# Patient Record
Sex: Female | Born: 1965 | Race: White | Hispanic: No | Marital: Married | Smoking: Current every day smoker
Health system: Southern US, Community
[De-identification: ages and names within clinical notes are randomized; demographics above are authoritative.]

## PROBLEM LIST (undated history)

## (undated) DIAGNOSIS — I219 Acute myocardial infarction, unspecified: Secondary | ICD-10-CM

## (undated) DIAGNOSIS — I2510 Atherosclerotic heart disease of native coronary artery without angina pectoris: Secondary | ICD-10-CM

## (undated) DIAGNOSIS — G8929 Other chronic pain: Secondary | ICD-10-CM

## (undated) DIAGNOSIS — K259 Gastric ulcer, unspecified as acute or chronic, without hemorrhage or perforation: Secondary | ICD-10-CM

## (undated) DIAGNOSIS — I10 Essential (primary) hypertension: Secondary | ICD-10-CM

## (undated) DIAGNOSIS — E785 Hyperlipidemia, unspecified: Secondary | ICD-10-CM

## (undated) DIAGNOSIS — K219 Gastro-esophageal reflux disease without esophagitis: Secondary | ICD-10-CM

## (undated) HISTORY — PX: APPENDECTOMY: SHX54

## (undated) HISTORY — DX: Gastro-esophageal reflux disease without esophagitis: K21.9

## (undated) HISTORY — DX: Gastric ulcer, unspecified as acute or chronic, without hemorrhage or perforation: K25.9

## (undated) HISTORY — DX: Atherosclerotic heart disease of native coronary artery without angina pectoris: I25.10

## (undated) HISTORY — DX: Other chronic pain: G89.29

## (undated) HISTORY — PX: ANKLE SURGERY: SHX546

## (undated) HISTORY — DX: Hyperlipidemia, unspecified: E78.5

## (undated) HISTORY — PX: OTHER SURGICAL HISTORY: SHX169

## (undated) HISTORY — DX: Acute myocardial infarction, unspecified: I21.9

## (undated) HISTORY — DX: Essential (primary) hypertension: I10

---

## 1999-10-14 ENCOUNTER — Encounter (INDEPENDENT_AMBULATORY_CARE_PROVIDER_SITE_OTHER): Payer: Self-pay

## 1999-10-14 ENCOUNTER — Ambulatory Visit (HOSPITAL_COMMUNITY): Admission: RE | Admit: 1999-10-14 | Discharge: 1999-10-14 | Payer: Self-pay | Admitting: Obstetrics and Gynecology

## 1999-11-25 ENCOUNTER — Encounter: Payer: Self-pay | Admitting: Obstetrics & Gynecology

## 1999-11-25 ENCOUNTER — Ambulatory Visit (HOSPITAL_COMMUNITY): Admission: RE | Admit: 1999-11-25 | Discharge: 1999-11-25 | Payer: Self-pay | Admitting: Obstetrics & Gynecology

## 2000-07-16 ENCOUNTER — Other Ambulatory Visit: Admission: RE | Admit: 2000-07-16 | Discharge: 2000-07-16 | Payer: Self-pay | Admitting: Obstetrics & Gynecology

## 2001-07-19 ENCOUNTER — Other Ambulatory Visit: Admission: RE | Admit: 2001-07-19 | Discharge: 2001-07-19 | Payer: Self-pay | Admitting: Obstetrics & Gynecology

## 2002-07-25 ENCOUNTER — Other Ambulatory Visit: Admission: RE | Admit: 2002-07-25 | Discharge: 2002-07-25 | Payer: Self-pay | Admitting: Obstetrics & Gynecology

## 2003-07-26 ENCOUNTER — Other Ambulatory Visit: Admission: RE | Admit: 2003-07-26 | Discharge: 2003-07-26 | Payer: Self-pay | Admitting: Obstetrics & Gynecology

## 2004-07-17 ENCOUNTER — Ambulatory Visit: Payer: Self-pay | Admitting: Family Medicine

## 2004-08-19 ENCOUNTER — Other Ambulatory Visit: Admission: RE | Admit: 2004-08-19 | Discharge: 2004-08-19 | Payer: Self-pay | Admitting: Obstetrics & Gynecology

## 2005-04-30 ENCOUNTER — Inpatient Hospital Stay (HOSPITAL_COMMUNITY): Admission: AD | Admit: 2005-04-30 | Discharge: 2005-04-30 | Payer: Self-pay | Admitting: Obstetrics & Gynecology

## 2005-05-03 ENCOUNTER — Inpatient Hospital Stay (HOSPITAL_COMMUNITY): Admission: AD | Admit: 2005-05-03 | Discharge: 2005-05-03 | Payer: Self-pay | Admitting: Obstetrics & Gynecology

## 2005-05-06 ENCOUNTER — Inpatient Hospital Stay (HOSPITAL_COMMUNITY): Admission: AD | Admit: 2005-05-06 | Discharge: 2005-05-06 | Payer: Self-pay | Admitting: Obstetrics & Gynecology

## 2005-05-13 ENCOUNTER — Inpatient Hospital Stay (HOSPITAL_COMMUNITY): Admission: AD | Admit: 2005-05-13 | Discharge: 2005-05-13 | Payer: Self-pay | Admitting: Obstetrics and Gynecology

## 2005-05-14 ENCOUNTER — Ambulatory Visit (HOSPITAL_COMMUNITY): Admission: RE | Admit: 2005-05-14 | Discharge: 2005-05-14 | Payer: Self-pay | Admitting: Obstetrics & Gynecology

## 2005-05-14 ENCOUNTER — Encounter (INDEPENDENT_AMBULATORY_CARE_PROVIDER_SITE_OTHER): Payer: Self-pay | Admitting: Specialist

## 2008-02-29 ENCOUNTER — Other Ambulatory Visit: Admission: RE | Admit: 2008-02-29 | Discharge: 2008-02-29 | Payer: Self-pay | Admitting: Gynecology

## 2010-02-25 ENCOUNTER — Inpatient Hospital Stay (HOSPITAL_COMMUNITY): Admission: EM | Admit: 2010-02-25 | Discharge: 2010-02-27 | Payer: Self-pay | Admitting: Emergency Medicine

## 2010-02-25 ENCOUNTER — Ambulatory Visit: Payer: Self-pay | Admitting: Internal Medicine

## 2010-03-01 ENCOUNTER — Encounter: Payer: Self-pay | Admitting: Cardiovascular Disease

## 2010-03-06 ENCOUNTER — Telehealth (INDEPENDENT_AMBULATORY_CARE_PROVIDER_SITE_OTHER): Payer: Self-pay | Admitting: *Deleted

## 2010-03-13 ENCOUNTER — Ambulatory Visit: Payer: Self-pay | Admitting: Physician Assistant

## 2010-03-13 ENCOUNTER — Telehealth (INDEPENDENT_AMBULATORY_CARE_PROVIDER_SITE_OTHER): Payer: Self-pay | Admitting: *Deleted

## 2010-03-13 ENCOUNTER — Telehealth: Payer: Self-pay | Admitting: Physician Assistant

## 2010-03-13 DIAGNOSIS — E785 Hyperlipidemia, unspecified: Secondary | ICD-10-CM | POA: Insufficient documentation

## 2010-03-13 DIAGNOSIS — I2119 ST elevation (STEMI) myocardial infarction involving other coronary artery of inferior wall: Secondary | ICD-10-CM | POA: Insufficient documentation

## 2010-03-13 DIAGNOSIS — I2510 Atherosclerotic heart disease of native coronary artery without angina pectoris: Secondary | ICD-10-CM

## 2010-03-13 DIAGNOSIS — R42 Dizziness and giddiness: Secondary | ICD-10-CM | POA: Insufficient documentation

## 2010-03-14 ENCOUNTER — Encounter (HOSPITAL_COMMUNITY)
Admission: RE | Admit: 2010-03-14 | Discharge: 2010-06-04 | Payer: Self-pay | Source: Home / Self Care | Attending: Cardiovascular Disease | Admitting: Cardiovascular Disease

## 2010-03-14 ENCOUNTER — Telehealth (INDEPENDENT_AMBULATORY_CARE_PROVIDER_SITE_OTHER): Payer: Self-pay | Admitting: *Deleted

## 2010-04-03 ENCOUNTER — Encounter: Payer: Self-pay | Admitting: Cardiovascular Disease

## 2010-05-02 ENCOUNTER — Ambulatory Visit: Payer: Self-pay | Admitting: Cardiovascular Disease

## 2010-05-08 ENCOUNTER — Ambulatory Visit
Admission: RE | Admit: 2010-05-08 | Discharge: 2010-05-08 | Payer: Self-pay | Source: Home / Self Care | Attending: Cardiovascular Disease | Admitting: Cardiovascular Disease

## 2010-05-08 ENCOUNTER — Other Ambulatory Visit: Payer: Self-pay | Admitting: Cardiovascular Disease

## 2010-05-08 LAB — HEPATIC FUNCTION PANEL
ALT: 27 U/L (ref 0–35)
AST: 27 U/L (ref 0–37)
Albumin: 3.8 g/dL (ref 3.5–5.2)
Alkaline Phosphatase: 55 U/L (ref 39–117)
Bilirubin, Direct: 0.1 mg/dL (ref 0.0–0.3)
Total Bilirubin: 0.5 mg/dL (ref 0.3–1.2)
Total Protein: 6.8 g/dL (ref 6.0–8.3)

## 2010-05-08 LAB — LIPID PANEL
Cholesterol: 104 mg/dL (ref 0–200)
HDL: 35.8 mg/dL — ABNORMAL LOW (ref >39.00)
LDL Cholesterol: 54 mg/dL (ref 0–99)
Total CHOL/HDL Ratio: 3
Triglycerides: 73 mg/dL (ref 0.0–149.0)
VLDL: 14.6 mg/dL (ref 0.0–40.0)

## 2010-05-09 ENCOUNTER — Encounter: Payer: Self-pay | Admitting: Cardiovascular Disease

## 2010-05-14 ENCOUNTER — Encounter: Payer: Self-pay | Admitting: Cardiovascular Disease

## 2010-06-04 NOTE — Assessment & Plan Note (Signed)
Summary: eph/jml   Visit Type:  new pt Primary Provider:  Dr. Aldona Bar [facility] Ob/Gyn)  CC:  dizziness.  History of Present Illness: Primary Cardiologist:  Dr. Tonny Bollman  Shelby Fitzpatrick is a 45 year old female who presented to [facility] on October 24 with chest discomfort.  Her EKG demonstrated an inferior ST elevation myocardial infarction.  She was taken emergently to the cardiac catheterization lab.  She had total occlusion of the RCA and this was treated with a PROMUS DES stent.  She had bradycardia and was eventually placed on beta blocker therapy prior to discharge.    Since discharge, she has had some "twinges" in her chest.  She denies any symptoms like her MI.  She is walking about 15 mins a day without chest pain or sob.  She denies syncope.  She does have some lightheadedness at times.  No orthopnea or PND.  No edema.  Her cath site feels ok.  Current Medications (verified): 1)  Nitrostat 0.4 Mg Subl (Nitroglycerin) .[name] Kitchen.. 1 Tablet Under Tongue At Onset of Chest Pain; You May Repeat Every 5 Minutes For Up To 3 Doses. 2)  Aspirin Ec 325 Mg Tbec (Aspirin) .... Take One Tablet By Mouth Daily 3)  Lipitor 80 Mg Tabs (Atorvastatin Calcium) .... Once Daily 4)  Metoprolol Succinate 25 Mg Xr24h-Tab (Metoprolol Succinate) .... Take1/2 Tablet Two Times A Day 5)  Effient 10 Mg Tabs (Prasugrel Hcl) .... Take One Tablet By Mouth Daily  Allergies (verified): 1)  ! Pcn  Past History:  Past Medical History: Last updated: 03/13/2010 Coronary artery disease    a.  s/p Inferior STEMI 02/25/2010    b.  Cath 02/25/2010:  100% RCA (PCI with Promus DES); minor LAD and CFX (nothing more than 20%)    c.  EF 65% at cath 02/25/2010 Hyperlipidemia h/o ectopic pregnancy (ruptured)  Past Surgical History: Last updated: 03/13/2010 s/p Promus DES to RCA 02/25/2010 (Inferior STEMI)  Family History: Last updated: 03/13/2010 Family History of Coronary Artery Disease: Father with MI  at 67  Social History: Last updated: 03/13/2010 The patient works as a Research officer, trade union.   She does smoke cigarettes but quit after MI 02/2010 She denies any alcohol or drug use.   Family History: Reviewed history from 03/13/2010 and no changes required. Family History of Coronary Artery Disease: Father with MI at 60  Social History: The patient works as a Research officer, trade union.   She does smoke cigarettes but quit after MI 02/2010 She denies any alcohol or drug use.   Review of Systems       As per  the HPI.  All other systems reviewed and negative.   Vital Signs:  Patient profile:   45 year old female Height:      67 inches Weight:      152.50 pounds BMI:     23.97 Pulse rate:   74 / minute BP sitting:   110 / 76  (left arm) Cuff size:   regular  Vitals Entered By: Caralee Ates CMA (March 13, 2010 8:59 AM)  Physical Exam  General:  Well nourished, well developed, in no acute distress HEENT: normal Neck: no JVD Cardiac:  normal S1, S2; RRR; no murmur Lungs:  clear to auscultation bilaterally, no wheezing, rhonchi or rales Abd: soft, nontender, no hepatomegaly Ext: no edema; RFA site without hematoma or bruit Vascular: no carotid  bruits Skin: warm and dry Neuro:  CNs 2-12 intact, no focal abnormalities noted  EKG  Procedure date:  03/13/2010  Findings:      Normal sinus rhythm with rate of:  66 leftward axis TW inversions in 3, aVF  Impression & Recommendations:  Problem # 1:  MYOCARDIAL INFARCTION, 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 Reassurance.  She has some anxiety and I asked her to follow up with her PCP or [place] if her symptoms do not improve over time. She starts cardiac rehab soon. I would suggest she go back to 1/2 time in 2 weeks, and gradually get back to full time.  Problem # 2:  CORONARY ATHEROSCLEROSIS NATIVE CORONARY ARTERY (ICD-414.01)  Continue ASA and Effient. No anginal symptoms. I answered all her questions today. Follow up with  Dr. Excell Seltzer in 6 weeks.  Problem # 3:  HYPERLIPIDEMIA (ICD-272.4) Check FLP and LFTs in 8 weeks.  Her updated medication list for this problem includes:    Lipitor 80 Mg Tabs (Atorvastatin calcium) ..... Once daily  Problem # 4:  DIZZINESS (ICD-780.4) She did have some bradycardia in the hospital. She may be symptomatic from the beta blocker.   I have asked her to take Toprol 1/2 tab once daily for a week, then go back to two times a day if she is ok with this.  Patient Instructions: 1)  Your physician recommends that you schedule a follow-up appointment in: 6 weeks with Dr. Excell Seltzer. 2)  Your physician recommends that you return for a FASTING lipid profile: Fasting Lipids, LFT in 8 weeks. 272.4. 3)  Your physician has recommended you make the following change in your medication: Take Metoprolol 25 mg 1/2 tablet by mouth once a day for 1 week, 1/2 tablet twice a day there after.

## 2010-06-04 NOTE — Miscellaneous (Signed)
Summary: [facility] Physician Order/Treatment Plan   [facility] Physician Order/Treatment Plan   Imported By: Roderic Ovens 03/19/2010 11:39:16  _____________________________________________________________________  External Attachment:    Type:   Image     Comment:   External Document

## 2010-06-04 NOTE — Progress Notes (Signed)
----   Converted from flag ---- ---- 03/12/2010 12:59 PM, Erle Crocker wrote: Flag Received  ---- 03/12/2010 12:59 PM, Bary Leriche wrote: Blanca Friend, I am sending you this message because Julieta Gutting is out until the 14th and I wanted to notify someone in [facility]. that a FMLA is on the way to Dr, Excell Seltzer to review.  The patient's name is Sumiko Ceasar, dob 06-Mar-2066/  She had a cath and stent on the 10-24.  unknown if she is still out on leave, so I highlighted that question for Dr.Cooper to see. Thank you for all your help, Elease Hashimoto at [employer]. ------------------------------

## 2010-06-04 NOTE — Progress Notes (Signed)
  Phone Note Outgoing Call   Summary of Call: Kathlene November I told this patient to go back to work post MI the week of Thanksgiving at 1/2 time and gradually increase to full time after that. I think you probably have her FMLA papers to sign.  So, I told her I would talk to you and let her know if you had put something different on her forms. Thanks  Initial call taken by: Brynda Rim,  March 13, 2010 11:03 AM  Follow-up for Phone Call        agreed - thx Aloha Bartok. I haven't seen forms but I'm out of town this week. Follow-up by: Norva Karvonen, MD,  March 14, 2010 10:38 AM

## 2010-06-04 NOTE — Progress Notes (Signed)
  Walk in Patient Form Recieved " pt left FMLA papers" sent to [facility]  March 06, 2010 11:17 AM

## 2010-06-04 NOTE — Progress Notes (Signed)
Summary: Records Request  Faxed OV & EKG to [facility] at [facility] ([phone]). Kayla Tripp  March 14, 2010 11:37 AM  

## 2010-06-06 NOTE — Letter (Signed)
Summary: [facility] - Lipid  [facility], Main Office  [address]   [place], [place] [address]   Phone: [phone]  Fax: [phone]     May 09, 2010 MRN: [id number]   Shelby Fitzpatrick [address] [place], [place]  [address]   Dear Ms. Rozier,  We have reviewed your cholesterol results.  They are as follows:     Total Cholesterol:    104 (Desirable: less than 200)       HDL  Cholesterol:     35.80  (Desirable: greater than 40 for men and 50 for women)       LDL Cholesterol:       54  (Desirable: less than 100 for low risk and less than 70 for moderate to high risk)       Triglycerides:       73.0  (Desirable: less than 150)  Our recommendations include: Your cholesterol numbers are at goal.  Liver function is normal.  Please continue your current medications.  Call our office at the number listed above if you have any questions.  Lowering your LDL cholesterol is important, but it is only one of a large number of "risk factors" that may indicate that you are at risk for heart disease, stroke or other complications of hardening of the arteries.  Other risk factors include:   A.  Cigarette Smoking* B.  High Blood Pressure* C.  Obesity* D.   Low HDL Cholesterol (see yours above)* E.   Diabetes Mellitus (higher risk if your is uncontrolled) F.  Family history of premature heart disease G.  Previous history of stroke or cardiovascular disease    *These are risk factors YOU HAVE CONTROL OVER.  For more information, visit .  There is now evidence that lowering the TOTAL CHOLESTEROL AND LDL CHOLESTEROL can reduce the risk of heart disease.  The American Heart Association recommends the following guidelines for the treatment of elevated cholesterol:  1.  If there is now current heart disease and less than two risk factors, TOTAL CHOLESTEROL should be less than 200 and LDL CHOLESTEROL should be less than 100. 2.  If there is current heart disease or two  or more risk factors, TOTAL CHOLESTEROL should be less than 200 and LDL CHOLESTEROL should be less than 70.  A diet low in cholesterol, saturated fat, and calories is the cornerstone of treatment for elevated cholesterol.  Cessation of smoking and exercise are also important in the management of elevated cholesterol and preventing vascular disease.  Studies have shown that 30 to 60 minutes of physical activity most days can help lower blood pressure, lower cholesterol, and keep your weight at a healthy level.  Drug therapy is used when cholesterol levels do not respond to therapeutic lifestyle changes (smoking cessation, diet, and exercise) and remains unacceptably high.  If medication is started, it is important to have you levels checked periodically to evaluate the need for further treatment options.  Thank you,  Julieta Gutting RN [facility] Team

## 2010-06-06 NOTE — Assessment & Plan Note (Signed)
Summary: per check out/sf   Visit Type:  Follow-up Primary Provider:  Dr. Aldona Bar [facility] Ob/Gyn)  CC:  No cardiac complaints.  History of Present Illness: 45 year-old presenting for followup evaluation after an inferior MI in October 2011. She was treated with primary PCI using a drug eluting stent in the RCA. The patient had mild nonobstructive disease elsewhere. She is doing well at present. She denies chest pain, dyspnea, edema, palpitations, or lightheadedness. The patient has done cardiac rehab and reports no symptoms with exertion. She is back to smoking cigarettes on occasion.  Current Medications (verified): 1)  Nitrostat 0.4 Mg Subl (Nitroglycerin) .[name] Kitchen.. 1 Tablet Under Tongue At Onset of Chest Pain; You May Repeat Every 5 Minutes For Up To 3 Doses. 2)  Aspirin Ec 325 Mg Tbec (Aspirin) .... Take One Tablet By Mouth Daily 3)  Lipitor 80 Mg Tabs (Atorvastatin Calcium) .... Once Daily 4)  Metoprolol Succinate 25 Mg Xr24h-Tab (Metoprolol Succinate) .... Take1/2 Tablet Two Times A Day 5)  Effient 10 Mg Tabs (Prasugrel Hcl) .... Take One Tablet By Mouth Daily  Allergies: 1)  ! Pcn  Past History:  Past medical history reviewed for relevance to current acute and chronic problems.  Past Medical History: Reviewed history from 03/13/2010 and no changes required. Coronary artery disease    a.  s/p Inferior STEMI 02/25/2010    b.  Cath 02/25/2010:  100% RCA (PCI with Promus DES); minor LAD and CFX (nothing more than 20%)    c.  EF 65% at cath 02/25/2010 Hyperlipidemia h/o ectopic pregnancy (ruptured)  Review of Systems       Negative except as per HPI   Vital Signs:  Patient profile:   45 year old female Height:      67 inches Weight:      156.50 pounds BMI:     24.60 Pulse rate:   68 / minute Pulse rhythm:   regular Resp:     18 per minute BP sitting:   114 / 78  (left arm) Cuff size:   regular  Vitals Entered By: Vikki Ports (May 02, 2010 8:45 AM)  Physical  Exam  General:  Pt is alert and oriented, in no acute distress. HEENT: normal Neck: normal carotid upstrokes without bruits, JVP normal Lungs: CTA CV: RRR without murmur or gallop Abd: soft, NT, positive BS, no bruit, no organomegaly Ext: no clubbing, cyanosis, or edema. peripheral pulses 2+ and equal Skin: warm and dry without rash    Impression & Recommendations:  Problem # 1:  CORONARY ATHEROSCLEROSIS NATIVE CORONARY ARTERY (ICD-414.01) The patient is progressing well after her MI about 8 weeks ago. Advised she should continue with her exercise program with a gradual increase as tolerated. Advised to reduce ASA dose to 81 mg daily in the setting of Effient. Counseled her regarding tobacco cessation extensively.  Her updated medication list for this problem includes:    Nitrostat 0.4 Mg Subl (Nitroglycerin) .[name] Kitchen... 1 tablet under tongue at onset of chest pain; you may repeat every 5 minutes for up to 3 doses.    Aspirin 81 Mg Tbec (Aspirin) .[name] Kitchen... Take one tablet by mouth daily    Metoprolol Succinate 25 Mg Xr24h-tab (Metoprolol succinate) .[name] Kitchen... Take1/2 tablet two times a day    Effient 10 Mg Tabs (Prasugrel hcl) .[name] Kitchen... Take one tablet by mouth daily  Problem # 2:  HYPERLIPIDEMIA (ICD-272.4) Pt scheduled for followup lipids and lft's in January. Goal LDL 70 mg/dL.  Her updated medication list  for this problem includes:    Lipitor 80 Mg Tabs (Atorvastatin calcium) ..... Once daily  Patient Instructions: 1)  Your physician recommends that you schedule a follow-up appointment in: 3 months. 2)  Your physician has recommended you make the following change in your medication: DECREASE ASA to 81mg  by mouth daily.

## 2010-06-06 NOTE — Miscellaneous (Signed)
Summary: [facility] Progress Report   [facility] Progress Report   Imported By: Roderic Ovens 04/23/2010 12:01:13  _____________________________________________________________________  External Attachment:    Type:   Image     Comment:   External Document

## 2010-06-12 NOTE — Letter (Signed)
Summary: Cardiac Rehab Program  Cardiac Rehab Program   Imported By: Marylou Mccoy 06/06/2010 13:58:29  _____________________________________________________________________  External Attachment:    Type:   Image     Comment:   External Document

## 2010-07-13 ENCOUNTER — Encounter: Payer: Self-pay | Admitting: Cardiovascular Disease

## 2010-07-15 ENCOUNTER — Other Ambulatory Visit: Payer: Self-pay | Admitting: Obstetrics & Gynecology

## 2010-07-17 LAB — BASIC METABOLIC PANEL
BUN: 12 mg/dL (ref 6–23)
CO2: 24 mEq/L (ref 19–32)
Calcium: 8.9 mg/dL (ref 8.4–10.5)
Chloride: 107 mEq/L (ref 96–112)
GFR calc Af Amer: >60 mL/min (ref >60)
GFR calc non Af Amer: >60 mL/min (ref >60)
Glucose, Bld: 94 mg/dL (ref 70–99)
Potassium: 3.4 mEq/L — ABNORMAL LOW (ref 3.5–5.1)
Potassium: 3.7 mEq/L (ref 3.5–5.1)
Sodium: 141 mEq/L (ref 135–145)

## 2010-07-17 LAB — COMPREHENSIVE METABOLIC PANEL
ALT: 29 U/L (ref 0–35)
AST: 63 U/L — ABNORMAL HIGH (ref 0–37)
Albumin: 3.2 g/dL — ABNORMAL LOW (ref 3.5–5.2)
Alkaline Phosphatase: 50 U/L (ref 39–117)
CO2: 25 mEq/L (ref 19–32)
Chloride: 109 mEq/L (ref 96–112)
Creatinine, Ser: 0.63 mg/dL (ref 0.4–1.2)
GFR calc Af Amer: >60 mL/min (ref >60)
GFR calc non Af Amer: >60 mL/min (ref >60)
Potassium: 4.1 mEq/L (ref 3.5–5.1)
Sodium: 137 mEq/L (ref 135–145)
Total Bilirubin: 0.3 mg/dL (ref 0.3–1.2)

## 2010-07-17 LAB — CBC
HCT: 37.1 % (ref 36.0–46.0)
HCT: 37.9 % (ref 36.0–46.0)
HCT: 40.7 % (ref 36.0–46.0)
Hemoglobin: 12.4 g/dL (ref 12.0–15.0)
Hemoglobin: 12.4 g/dL (ref 12.0–15.0)
MCV: 91.2 fL (ref 78.0–100.0)
MCV: 92.1 fL (ref 78.0–100.0)
Platelets: 263 10*3/uL (ref 150–400)
RBC: 4.07 MIL/uL (ref 3.87–5.11)
RBC: 4.09 MIL/uL (ref 3.87–5.11)
RBC: 4.42 MIL/uL (ref 3.87–5.11)
RDW: 13 % (ref 11.5–15.5)
WBC: 10.3 10*3/uL (ref 4.0–10.5)
WBC: 11.1 10*3/uL — ABNORMAL HIGH (ref 4.0–10.5)

## 2010-07-17 LAB — POCT I-STAT, CHEM 8
Creatinine, Ser: 0.8 mg/dL (ref 0.4–1.2)
Hemoglobin: 13.3 g/dL (ref 12.0–15.0)
Potassium: 3.4 mEq/L — ABNORMAL LOW (ref 3.5–5.1)
Sodium: 139 mEq/L (ref 135–145)

## 2010-07-17 LAB — CARDIAC PANEL(CRET KIN+CKTOT+MB+TROPI)
CK, MB: 104.4 ng/mL (ref 0.3–4.0)
CK, MB: 19.6 ng/mL (ref 0.3–4.0)
CK, MB: 62.5 ng/mL (ref 0.3–4.0)
Relative Index: 13.7 — ABNORMAL HIGH (ref 0.0–2.5)
Relative Index: 13.9 — ABNORMAL HIGH (ref 0.0–2.5)
Relative Index: 5.9 — ABNORMAL HIGH (ref 0.0–2.5)
Total CK: 587 U/L — ABNORMAL HIGH (ref 7–177)
Total CK: 762 U/L — ABNORMAL HIGH (ref 7–177)
Troponin I: 11.72 ng/mL (ref 0.00–0.06)

## 2010-07-17 LAB — PROTIME-INR: Prothrombin Time: 12.7 seconds (ref 11.6–15.2)

## 2010-07-17 LAB — POCT CARDIAC MARKERS
CKMB, poc: <1 ng/mL — ABNORMAL LOW (ref 1.0–8.0)
Myoglobin, poc: 46.4 ng/mL (ref 12–200)

## 2010-07-17 LAB — DIFFERENTIAL
Eosinophils Relative: 3 % (ref 0–5)
Lymphocytes Relative: 39 % (ref 12–46)
Lymphs Abs: 4.1 10*3/uL — ABNORMAL HIGH (ref 0.7–4.0)
Monocytes Absolute: 0.8 10*3/uL (ref 0.1–1.0)
Neutro Abs: 5.1 10*3/uL (ref 1.7–7.7)

## 2010-07-17 LAB — LIPID PANEL: VLDL: 17 mg/dL (ref 0–40)

## 2010-07-17 LAB — APTT: aPTT: 26 seconds (ref 24–37)

## 2010-07-29 ENCOUNTER — Encounter: Payer: Self-pay | Admitting: Cardiovascular Disease

## 2010-07-29 ENCOUNTER — Ambulatory Visit (INDEPENDENT_AMBULATORY_CARE_PROVIDER_SITE_OTHER): Payer: Self-pay | Admitting: Cardiovascular Disease

## 2010-07-29 DIAGNOSIS — E785 Hyperlipidemia, unspecified: Secondary | ICD-10-CM

## 2010-07-29 DIAGNOSIS — I2510 Atherosclerotic heart disease of native coronary artery without angina pectoris: Secondary | ICD-10-CM

## 2010-07-29 NOTE — Assessment & Plan Note (Signed)
The patient now has recurrent chest pain, though different from her symptoms associated with the MI. Recommend stress echocardiogram to rule out significant ischemia. Otherwise she should continue on her current medical program with aspirin and prasugrel. I asked her to reduce her aspirin dose to 81 mg daily. She is tolerating medical therapy with a statin and beta blocker.

## 2010-07-29 NOTE — Patient Instructions (Signed)
Your physician has requested that you have a stress echocardiogram. For further information please visit [email or web address] Please follow instruction sheet as given.  Your physician has recommended you make the following change in your medication: DECREASE Aspirin to 81mg  once a day  Your physician recommends that you return for a FASTING lipid, liver and BMP in 6 MONTHS (412, 272.0, 786.51)---nothing to eat or drink after midnight, lab opens at 8:30.  Your physician wants you to follow-up in: 6 MONTHS.  You will receive a reminder letter in the mail two months in advance. If you don't receive a letter, please call our office to schedule the follow-up appointment.

## 2010-07-29 NOTE — Progress Notes (Signed)
HPI:  [name] presenting for followup of CAD. She presented in October 2011 with an acute inferior wall MI and was treated with primary PCI.  A drug-eluting stent platform was used. She otherwise had mild nonobstructive coronary disease.  The patient has noted left-sided chest pain with emotional stress. Some of these episodes are fleeting and others last for several minutes. Her symptoms are unlike those at the time of her heart attack. She continues with an exercise program and denies exertional chest tightness or pain. She denies dyspnea, palpitations, lightheadedness, orthopnea, or PND. She quit smoking for approximately 6 weeks following her MI, and has resumed smoking since that time.  Outpatient Encounter Prescriptions as of 07/29/2010  Medication Sig Dispense Refill  . aspirin 325 MG tablet Take 325 mg by mouth daily.        [name] Kitchen atorvastatin (LIPITOR) 80 MG tablet Take 80 mg by mouth daily.        . metoprolol succinate (TOPROL-XL) 25 MG 24 hr tablet 1/2 tab po bid       . nitroGLYCERIN (NITROSTAT) 0.4 MG SL tablet Place 0.4 mg under the tongue every 5 (five) minutes as needed.        . prasugrel (EFFIENT) 10 MG TABS 1 tab po qd       . DISCONTD: aspirin 81 MG tablet Take 81 mg by mouth daily.         Allergies  Allergen Reactions  . Penicillins     Past Medical History  Diagnosis Date  . CAD (coronary artery disease)     coronary intervention and drug-eluting stent placement STEMI-2011   Cath 02/25/2010:  100% RCA (PCI with Promus DES); minor LAD and CFX (nothing more than 20%)  EF 65% at cath 02/25/2010  . Hyperlipemia   . Ectopic pregnancy     (ruptured)    ROS: Negative except as per HPI  BP 122/80  Pulse 75  Resp 18  Ht 5\' 7"  (1.702 m)  Wt 153 lb (69.4 kg)  BMI 23.96 kg/m2  PHYSICAL EXAM: Pt is alert and oriented, NAD HEENT: normal Neck: JVP - normal, carotids 2+= without bruits Lungs: CTA bilaterally CV: RRR without murmur or gallop Abd: soft,  NT, Positive BS, no hepatomegaly Ext: no C/C/E, distal pulses intact and equal Skin: warm/dry no rash  EKG:  Normal sinus rhythm with sinus arrhythmia, 75 beats per minute, leftward axis.  ASSESSMENT AND PLAN:

## 2010-07-29 NOTE — Assessment & Plan Note (Signed)
Last lipids in January showed LDL at goal of less than 70 mg per deciliter. Will continue high-dose atorvastatin and followup lipids and LFTs in 6 months.

## 2010-08-07 ENCOUNTER — Ambulatory Visit (HOSPITAL_COMMUNITY): Payer: Managed Care, Other (non HMO) | Attending: Cardiovascular Disease | Admitting: Radiology

## 2010-08-07 ENCOUNTER — Ambulatory Visit (HOSPITAL_BASED_OUTPATIENT_CLINIC_OR_DEPARTMENT_OTHER): Payer: Managed Care, Other (non HMO) | Admitting: Radiology

## 2010-08-07 DIAGNOSIS — I2510 Atherosclerotic heart disease of native coronary artery without angina pectoris: Secondary | ICD-10-CM | POA: Insufficient documentation

## 2010-08-07 DIAGNOSIS — E785 Hyperlipidemia, unspecified: Secondary | ICD-10-CM | POA: Insufficient documentation

## 2010-08-07 DIAGNOSIS — R072 Precordial pain: Secondary | ICD-10-CM

## 2010-08-07 DIAGNOSIS — R0789 Other chest pain: Secondary | ICD-10-CM

## 2010-09-20 NOTE — Op Note (Signed)
NAME:  Shelby Fitzpatrick, Shelby Fitzpatrick NO.:  [id number]   MEDICAL RECORD NO.:  [id number]          PATIENT TYPE:  AMB   LOCATION:  SDC                           FACILITY:  [facility]   PHYSICIAN:  Ilda Mori, M.D.   DATE OF BIRTH:  1966-02-29   DATE OF PROCEDURE:  05/14/2005  DATE OF DISCHARGE:                                 OPERATIVE REPORT   PREOPERATIVE DIAGNOSIS:  Unruptured left ectopic pregnancy.   POSTOPERATIVE DIAGNOSIS:  Unruptured left ectopic pregnancy.   PROCEDURE:  Left salpingostomy with removal of ectopic.   SURGEON:  Ilda Mori, M.D.   ANESTHESIA:  General endotracheal.   ESTIMATED BLOOD LOSS:  100 mL.   FINDINGS:  There was a 3 cm mass at the infundibular portion of the left  fallopian tube.  There was a small, clear ovarian cyst on the left ovary.  The right ovary was normal, and the right tube was completely normal.  The  pelvis was without significant adhesions, except some adhesions from the  fimbriated area on the left side to the ovary.   INDICATIONS:  This is a 45 year old gravida 6, para 1-0-5-1, who was found  to have an ectopic pregnancy on December27.  A decision was made to treat  her with methotrexate.  She had a slow response to methotrexate.  A week  after her values had fallen to 13,000 after an initial high of 17,000.  A  week later a were still 12,000.  The patient was asymptomatic.  A  repeat  ultrasound was done which showed a small fetal pole yolk sac in the left  infundibular portion of the fallopian tube.  Options of repeating the  methotrexate or surgically removing the ectopic were discussed with the  patient, and she elected to have the surgery.   PROCEDURE:  The patient was taken to the operating room.  General  endotracheal anesthesia was induced.  She was then placed in dorsal  lithotomy position and the abdomen, perineum and vagina were prepped and  draped in sterile fashion.  The bladder was catheterized.  The anterior  lip  of the cervix was grasped with a single-tooth tenaculum and a Hulka  tenaculum was placed into the endocervical canal and affixed to the anterior  lip of the cervix.  A Veress needle was introduced into the cul-de-sac.  Pneumoperitoneum was created and the surgeon re-gowned and gloved.  An  incision as made in the base the umbilicus and the laparoscopic trocar was  introduced into the pneumoperitoneum.  Under direct visualization an  accessory instrument was placed through a suprapubic stab wound.  The  ectopic pregnancy was identified on the left side.  By using unipolar needle  cautery, the ectopic was opened in the antimesenteric border and forceps  were used to remove the ectopic pregnancy tissue.  Bleeding was quite  constant from the area and required bipolar cautery along the edges and deep  into the tube to control the bleeding.  Finally the bleeding ceased.  A  second small ovarian 4 cm cyst on the left ovary was drained using the  needle  cautery.  Attention was then turned to the right adnexa, which was  confirmed to be perfectly normal.  There  was no other pathology in the pelvis.  The gas was allowed to escape.  The  umbilical incision was closed with a subcuticular 4-0 Dexon suture and the  suprapubic stab wound was closed with Dermabond adhesive.  The patient  tolerated the procedure well and left the operating room in good condition.      Ilda Mori, M.D.  Electronically Signed     RK/MEDQ  D:  05/14/2005  T:  05/14/2005  Job:  [id number]

## 2010-09-20 NOTE — Op Note (Signed)
[facility]  Patient:    Shelby Fitzpatrick, Shelby Fitzpatrick                    MRN: [id number] Proc. Date: 10/14/99 Adm. Date:  [id number] Disc. Date: [id number] Attending:  Conley Simmonds A                           Operative Report  PREOPERATIVE DIAGNOSIS:       Ectopic pregnancy.  Right adnexal cyst.  POSTOPERATIVE DIAGNOSIS:      Right ampullary ectopic pregnancy, unruptured. Hemoperitoneum.  Right corpus luteum cyst.  Left hydatid cyst.  OPERATION:                    Dilatation and evacuation, laparoscopic right salpingostomy, aspiration and drainage of right ovarian cyst, evacuation of hemoperitoneum.  SURGEON:                      Conley Simmonds, M.D.  ASSISTANT:  ANESTHESIA:                   General endotracheal anesthesia.  IV FLUIDS:                    1600 cc of Ringers lactate.  ESTIMATED BLOOD LOSS:         100 cc, hemoperitoneum 100 cc.  URINE OUTPUT:                 400 cc.  COMPLICATIONS:                None.  INDICATIONS:                  The patient was a 45 year old, gravida 61, para 1,  Caucasian female with a last menstrual period on Sep 25, 1999, who presented to the office on October 09, 1999, with the complaint of pelvic pressure and vaginal bleeding. The patient had a positive urine pregnancy test.  The patient demonstrated no evidence of an acute abdomen, and an ultrasound was performed which documented o evidence of an intrauterine pregnancy and a simple right adnexal cyst measuring 6.7 cm in the largest diameter.  There was some free fluid noted around this cyst. The left ovary was noted to demonstrate some small follicles.  The patient had quantitate serial beta hCGs which documented a level of 7394 on October 09, 1999, and 8300 on October 11, 1999.  The patients history was significant for a hydrosalpinx or which she underwent a prior tubal surgical repair.  Her blood type was noted to be O positive.  The patient was given the  diagnosis of a probable ectopic pregnancy, and a right adnexal cyst.  After discussing the risks, benefits, and alternatives, plan was  made to proceed with surgical evaluation and treatment.  Examination under anesthesia revealed a small, anteverted, mobile uterus. There was evidence of right adnexal fullness.  No left adnexal masses were appreciated.  The dilatation and evacuation demonstrated a uterus which sounded to 10 cm. There was a minimal amount of tissue noted from within the intrauterine cavity.  This was sent to pathology for frozen section which confirmed the absence of villi.  Laparoscopy demonstrated a hemoperitoneum of approximately 100 cc.  There was a  nonruptured 3 cm right ampullary ectopic pregnancy, and there was a 7 cm right ovarian cyst.  The cyst was aspirated and was noted to  contain clear yellow fluid. The left fallopian tube was normal and had a 1 cm hydatid cyst noted at its fimbriated end.  The left ovary was noted to be normal.  There were no abnormalities appreciated of the uterus, intestines, liver, or stomach.  SPECIMENS:                    Endometrial curettings and the right ectopic pregnancy tissue was sent to pathology separately.  The cyst fluid was sent for  cytology.  DESCRIPTION OF PROCEDURE:     With an IV in place, the patient was taken to the  operating room after she was properly identified.  She received clindamycin 900 mg intravenously prior to her procedure.  The patient received general endotracheal anesthesia, and she was then placed in the dorsal lithotomy position.  Her abdomen and vagina were sterilely prepped and draped and a Foley catheter was placed inside the urinary bladder.  A speculum was placed inside the vagina, the single tooth tenaculum was placed n the anterior cervical lip.  The uterus was sounded, and it was then serially dilated to a #23 Pratt dilator.  The endometrial cavity was curetted sharply  in all four quadrants, and a suction cannula was then inserted through the cervix to the level of the fundus.  Suction was applied, and the cannula was withdrawn to remove any remaining tissue and clots.  At the end of this procedure, hemostasis was noted to be excellent.  A Hulka tenaculum was used to replace the single tooth tenaculum, and the other instruments were removed from the vagina.  Attention was turned to the abdomen where a laparoscopy was performed at this time. A 1 cm umbilical incision was created sharply with the scalpel.  This was carried down to the fascia with an Allis clamp, and a 10 mm trocar was inserted directly into the abdominal cavity.  The laparoscope confirmed proper intraperitoneal placement, and a pneumoperitoneum was then achieved.  The patient was placed in the Trendelenburg position, and two accessory 5 mm trocars were placed in the right and left lower quadrants under visualization of the laparoscope.  An inspection of the pelvic and abdominal organs were performed, and the findings are as noted above.  The procedure  began with monopolar coagulation of the ectopic pregnancy along the antimesenteric border.  The fallopian tube was then incised in a linear fashion, and the ectopic pregnancy as removed.  The left lower quadrant was converted to a 12 mm trocar, and the specimen was removed and sent to pathology.  Bleeding along the edges of the fallopian tube and at the base of the location of the ectopic pregnancy was controlled with bipolar cautery.  A left lateral midabdominal 5 mm trocar was placed under visualization of the laparoscope to assist in this portion of the procedure. The area was carefully irrigated, and hemostasis was noted to be excellent at the end of this portion of the procedure.  The right ovarian cyst was next aspirated with a laparoscopic needle, and the fluid was sent to pathology.  This cyst was adequately  collapsed at this time, and there was no bleeding from the cortex.  All of the operative sites were reexamined, and there was no evidence of any bleeding.  After the pneumoperitoneum was partially released.  The trocars were   removed under direct visualization of the laparoscope.  The pneumoperitoneum was released, and the left lower quadrant incision was closed along the fascia using a figure-of-eight  suture of 0 Vicryl.  All skin incisions were closed with subcuticular sutures of 3-0 plain gut suture.  Steri-Strips and Benzoin were placed over the incisions followed by sterile bandages.  All of the instruments were removed from the vagina and the bladder.  The patient was taken out of the dorsal lithotomy position.  The patient was awakened, extubated, and she was escorted to the recovery room in stable and awake condition. There were no complications to the procedure.  Sponge, needle, and instrument counts were correct. DD:  10/14/99 TD:  10/16/99 Job: [id number]

## 2010-09-26 ENCOUNTER — Other Ambulatory Visit: Payer: Self-pay | Admitting: Nurse Practitioner

## 2011-01-24 ENCOUNTER — Encounter: Payer: Self-pay | Admitting: Cardiovascular Disease

## 2011-01-28 ENCOUNTER — Encounter: Payer: Self-pay | Admitting: Cardiovascular Disease

## 2011-01-28 ENCOUNTER — Ambulatory Visit (INDEPENDENT_AMBULATORY_CARE_PROVIDER_SITE_OTHER): Payer: Managed Care, Other (non HMO) | Admitting: Cardiovascular Disease

## 2011-01-28 DIAGNOSIS — I2510 Atherosclerotic heart disease of native coronary artery without angina pectoris: Secondary | ICD-10-CM

## 2011-01-28 DIAGNOSIS — E785 Hyperlipidemia, unspecified: Secondary | ICD-10-CM

## 2011-01-28 DIAGNOSIS — E7800 Pure hypercholesterolemia, unspecified: Secondary | ICD-10-CM

## 2011-01-28 NOTE — Assessment & Plan Note (Signed)
LDL is at goal at the time of her last lipid panel about 9 months ago. She is going to be scheduled for followup lipids and LFTs in January 2013.

## 2011-01-28 NOTE — Assessment & Plan Note (Signed)
The patient is stable without angina. She is approaching 12 months out from her myocardial infarction and stent implantation. I advised that she can discontinue effient one she is at 12 months. We discussed the risk/benefit of continued dual antiplatelet therapy and that guidelines recommend 12 months.  She had a single stent implanted and a second generation DES platform was utilized, therefore I think overall risk benefit as favorable for discontinuing effient. She otherwise will continue her current medical program. We discussed the importance of complete tobacco cessation. She is motivated to get back into an exercise program.

## 2011-01-28 NOTE — Patient Instructions (Addendum)
Your physician wants you to follow-up in: 1 YEAR.  You will receive a reminder letter in the mail two months in advance. If you don't receive a letter, please call our office to schedule the follow-up appointment.  Your physician recommends that you return for a FASTING lipid and liver profile in January 2013 (414.01, 272.0)--nothing to eat or drink after midnight.--the pt will call back to schedule these labs  Your physician recommends that you continue on your current medications as directed. Please refer to the Current Medication list given to you today.  You can stop Effient in 1 MONTH.

## 2011-01-28 NOTE — Progress Notes (Signed)
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 She presented with the acute onset of substernal chest pain and ultimately underwent primary PCI of right coronary artery using a drug-eluting stent platform. She has been maintained on dual antiplatelet therapy utilizing aspirin and effient over the last 12 months. The patient is done well without recurrent cardiopulmonary symptoms. She specifically denies chest pain or pressure. She denies dyspnea, edema, or palpitations. She has gone back to smoking cigarettes because of stress at work. She still walks on occasion for exercise but has not been doing any regular physical exertion. The patient's left ventricular function has been well preserved with an EF of about 60%.  Outpatient Encounter Prescriptions as of 01/28/2011  Medication Sig Dispense Refill  . aspirin 81 MG tablet Take 1 tablet (81 mg total) by mouth daily.      [name] Kitchen atorvastatin (LIPITOR) 80 MG tablet TAKE 1 TABLET BY MOUTH EVERY NIGHT AT BEDTIME  30 tablet  6  . EFFIENT 10 MG TABS TAKE 1 TABLET BY MOUTH ONCE DAILY  30 tablet  6  . metoprolol tartrate (LOPRESSOR) 25 MG tablet TAKE 1/2 TABLET BY MOUTH TWICE DAILY  30 tablet  6  . nitroGLYCERIN (NITROSTAT) 0.4 MG SL tablet Place 0.4 mg under the tongue every 5 (five) minutes as needed.        [name] Kitchen DISCONTD: metoprolol succinate (TOPROL-XL) 25 MG 24 hr tablet 1/2 tab po bid         Allergies  Allergen Reactions  . Penicillins     Past Medical History  Diagnosis Date  . CAD (coronary artery disease)     coronary intervention and drug-eluting stent placement STEMI-2011   Cath 02/25/2010:  100% RCA (PCI with Promus DES); minor LAD and CFX (nothing more than 20%)  EF 65% at cath 02/25/2010  . Hyperlipemia   . Ectopic pregnancy     (ruptured)    ROS: Negative except as per HPI  BP 122/86  Pulse 68  Ht 5\' 7"  (1.702 m)  Wt 157 lb (71.215 kg)  BMI 24.59 kg/m2  PHYSICAL EXAM: Pt is alert and  oriented, NAD HEENT: normal Neck: JVP - normal, carotids 2+= without bruits Lungs: CTA bilaterally CV: RRR without murmur or gallop Abd: soft, NT, Positive BS, no hepatomegaly Ext: no C/C/E, distal pulses intact and equal Skin: warm/dry no rash  EKG:  Normal sinus rhythm 69 beats per minute, within normal limits.  ASSESSMENT AND PLAN:

## 2011-01-31 ENCOUNTER — Telehealth: Payer: Self-pay | Admitting: Cardiovascular Disease

## 2011-01-31 NOTE — Telephone Encounter (Signed)
Pt has nasal congestion and she takes lipitor and metoprolol and she wants to know what she can take

## 2011-01-31 NOTE — Telephone Encounter (Signed)
I talked with pt. I told pt she could try saline nasal spray or coricidin, but she should not use a decongestant.

## 2011-03-25 ENCOUNTER — Other Ambulatory Visit: Payer: Self-pay | Admitting: Cardiovascular Disease

## 2011-04-21 ENCOUNTER — Other Ambulatory Visit: Payer: Self-pay | Admitting: Cardiovascular Disease

## 2011-04-21 ENCOUNTER — Other Ambulatory Visit: Payer: Self-pay | Admitting: Nurse Practitioner

## 2011-07-28 ENCOUNTER — Other Ambulatory Visit (INDEPENDENT_AMBULATORY_CARE_PROVIDER_SITE_OTHER): Payer: Managed Care, Other (non HMO)

## 2011-07-28 DIAGNOSIS — I2510 Atherosclerotic heart disease of native coronary artery without angina pectoris: Secondary | ICD-10-CM

## 2011-07-28 DIAGNOSIS — E785 Hyperlipidemia, unspecified: Secondary | ICD-10-CM

## 2011-07-28 DIAGNOSIS — E7800 Pure hypercholesterolemia, unspecified: Secondary | ICD-10-CM

## 2011-07-28 LAB — HEPATIC FUNCTION PANEL
ALT: 21 U/L (ref 0–35)
AST: 22 U/L (ref 0–37)
Alkaline Phosphatase: 61 U/L (ref 39–117)
Total Bilirubin: 0.3 mg/dL (ref 0.3–1.2)

## 2011-07-28 LAB — BASIC METABOLIC PANEL
CO2: 26 mEq/L (ref 19–32)
Calcium: 8.7 mg/dL (ref 8.4–10.5)
Creatinine, Ser: 0.7 mg/dL (ref 0.4–1.2)
GFR: 105.19 mL/min (ref >60.00)
Glucose, Bld: 89 mg/dL (ref 70–99)

## 2011-07-28 LAB — LIPID PANEL
HDL: 38.5 mg/dL — ABNORMAL LOW (ref >39.00)
Total CHOL/HDL Ratio: 2
Triglycerides: 49 mg/dL (ref 0.0–149.0)

## 2011-07-31 ENCOUNTER — Telehealth: Payer: Self-pay | Admitting: Cardiovascular Disease

## 2011-07-31 MED ORDER — ATORVASTATIN CALCIUM 20 MG PO TABS: 20.0000 mg | ORAL_TABLET | Freq: Every day | ORAL | Status: DC

## 2011-07-31 NOTE — Telephone Encounter (Signed)
I spoke with the pt and made her aware of lab results.  The pt will decrease Atorvastatin to 20mg  daily.  New Rx sent to pharmacy.

## 2011-07-31 NOTE — Telephone Encounter (Signed)
FU Call: Pt returning call from our office regarding pt lab results. Please return pt call to discuss further.  

## 2011-10-09 ENCOUNTER — Other Ambulatory Visit: Payer: Self-pay | Admitting: Cardiovascular Disease

## 2011-10-21 ENCOUNTER — Telehealth: Payer: Self-pay | Admitting: Cardiovascular Disease

## 2011-10-21 NOTE — Telephone Encounter (Signed)
Pt calling re BP this am 140/83 denies any symptoms except ringing in her ears, but says that's normal

## 2011-10-21 NOTE — Telephone Encounter (Signed)
I spoke with the pt and she has been monitoring her BP for the past week.  The BP has been 140/83 and 140/94.  I made the pt aware that this is borderline and she should continue to monitor her BP.  I also spoke with her about decreasing her salt intake.  The pt will contact our office if her BP continues to increase over time.  The pt denies any other symptoms.   The pt also asked about Effient.  I made her aware that at her appointment in September Dr Excell Seltzer advised her that this could be discontinued.  The pt did not stop Effient.  I instructed her that she can stop the medication but cannot stop ASA. Pt agreed with plan.

## 2012-01-18 ENCOUNTER — Ambulatory Visit (INDEPENDENT_AMBULATORY_CARE_PROVIDER_SITE_OTHER): Payer: Managed Care, Other (non HMO) | Admitting: Internal Medicine

## 2012-01-18 VITALS — BP 133/77 | HR 83 | Temp 98.8°F | Resp 16 | Ht 66.5 in | Wt 159.0 lb

## 2012-01-18 DIAGNOSIS — J019 Acute sinusitis, unspecified: Secondary | ICD-10-CM

## 2012-01-18 DIAGNOSIS — F17200 Nicotine dependence, unspecified, uncomplicated: Secondary | ICD-10-CM | POA: Insufficient documentation

## 2012-01-18 MED ORDER — AZITHROMYCIN 500 MG PO TABS: 500.0000 mg | ORAL_TABLET | Freq: Every day | ORAL | Status: AC

## 2012-01-18 NOTE — Progress Notes (Signed)
  Subjective:    Patient ID: Shelby Fitzpatrick, female    DOB: December 25, 1965, 46 y.o.   MRN: [id number]  HPI  Pt feels unwell. Started Tues with a ST that comes and goes. Has woke up with night sweats the last 2 nights. Nose is very congested. Nausea Thurs and Fri. No urinary sxs, no vomiting, no cough. Husband and daughter are sick.    Pos smoker. Wants to quit.Past efforts at quitting failed because she becomes very irritable and hard to live with. Feels physical withdrawal symptoms.  H/o MI 2 yrs ago.Now on beta blockers in statins/no further problems/had inferior MI with 100% blockage of one vessel which was stented Has a twin Sister -No mi Married with 54 y.o daughter.   Review of Systems No recent weight loss No easy fatigability No dyspnea on exertion No edema    Objective:   Physical Exam TMs clear Nares congested with purulent discharge/tender maxillary areas Throat clear Lungs clear except mild wheeze at end inspiration and disappears with coughing Heart regular without murmur     Assessment & Plan:  Problem # 1:Sinusitis Sudafed 12h zith 500qd 5d  Problem #2 nicotine addiction Cessation plan made for gradual cessation with use of gum if needed/discussed and since her husband/followup in 2 months if not successful  Twin sister needs cardiac cath

## 2012-01-18 NOTE — Progress Notes (Deleted)
  Subjective:    Patient ID: Shelby Fitzpatrick, female    DOB: January 28, 1966, 46 y.o.   MRN: [id number]  HPI    Review of Systems     Objective:   Physical Exam        Assessment & Plan:

## 2012-02-04 ENCOUNTER — Ambulatory Visit (INDEPENDENT_AMBULATORY_CARE_PROVIDER_SITE_OTHER): Payer: Managed Care, Other (non HMO) | Admitting: Cardiovascular Disease

## 2012-02-04 ENCOUNTER — Encounter: Payer: Self-pay | Admitting: Cardiovascular Disease

## 2012-02-04 VITALS — BP 132/84 | HR 75 | Ht 67.0 in | Wt 160.0 lb

## 2012-02-04 DIAGNOSIS — I2510 Atherosclerotic heart disease of native coronary artery without angina pectoris: Secondary | ICD-10-CM

## 2012-02-04 DIAGNOSIS — E7800 Pure hypercholesterolemia, unspecified: Secondary | ICD-10-CM

## 2012-02-04 NOTE — Patient Instructions (Addendum)
Your physician recommends that you continue on your current medications as directed. Please refer to the Current Medication list given to you today.   Your physician recommends that you return for a FASTING lipid profile: lipid/lft   Your physician wants you to follow-up in: ONE YEAR WITH DR. Excell Seltzer.  You will receive a reminder letter in the mail two months in advance. If you don't receive a letter, please call our office to schedule the follow-up appointment.

## 2012-02-04 NOTE — Progress Notes (Signed)
HPI:  46 year-old woman presenting for followup of coronary artery disease. In 2011 she initially presented with an inferior wall MI treated with primary PCI (drug-eluting stent in the RCA). She's had preserved LV function with an ejection fraction of 60%. She stopped taking effient when she was 12 months out from her PCI procedure. She's been maintained on antiplatelet therapy with aspirin. She presents today for followup evaluation. Lipids were last checked in March 2013 when she was shown to have a cholesterol of 77, HDL 38, and LDL 29. Her triglycerides were 49. At that time her Lipitor was decreased to 20 mg daily.  The patient has no complaints today from a cardiac perspective. She denies chest pain or pressure, dyspnea, palpitations, orthopnea, or PND. She had sinus congestion a few weeks ago had some associated lightheadedness. This is resolving.  She complains of chronic headaches. She takes Excedrin about twice per day. She continues to smoke cigarettes less than one pack per day. She quit for about 6 weeks after her heart attack.  Outpatient Encounter Prescriptions as of 02/04/2012  Medication Sig Dispense Refill  . aspirin-acetaminophen-caffeine (EXCEDRIN MIGRAINE) 250-250-65 MG per tablet Take 2 tablets by mouth every 6 (six) hours as needed.      [name] Kitchen aspirin 81 MG tablet Take 1 tablet (81 mg total) by mouth daily.      [name] Kitchen atorvastatin (LIPITOR) 20 MG tablet Take 1 tablet (20 mg total) by mouth daily.  30 tablet  6  . metoprolol tartrate (LOPRESSOR) 25 MG tablet TAKE 1/2 TABLET BY MOUTH TWICE DAILY  30 tablet  5  . NITROSTAT 0.4 MG SL tablet PLACE 1 TABLET UNDER TONGUE EVERY 5 MINUTES 3 TIMES AS NEEDED AS DIRECTED  25 tablet  6    Allergies  Allergen Reactions  . Penicillins     Past Medical History  Diagnosis Date  . CAD (coronary artery disease)     coronary intervention and drug-eluting stent placement STEMI-2011   Cath 02/25/2010:  100% RCA (PCI with Promus DES); minor LAD and  CFX (nothing more than 20%)  EF 65% at cath 02/25/2010  . Hyperlipemia   . Ectopic pregnancy     (ruptured)    ROS: Negative except as per HPI  BP 132/84  Pulse 75  Ht 5\' 7"  (1.702 m)  Wt 72.576 kg (160 lb)  BMI 25.06 kg/m2  LMP 01/17/2012  PHYSICAL EXAM: Pt is alert and oriented, NAD HEENT: normal Neck: JVP - normal, carotids 2+= without bruits Lungs: CTA bilaterally CV: RRR without murmur or gallop Abd: soft, NT, Positive BS, no hepatomegaly Ext: no C/C/E, distal pulses intact and equal Skin: warm/dry no rash  EKG:  Normal sinus rhythm 75 beats per minute, within normal limits.  ASSESSMENT AND PLAN: 1. Coronary artery disease, native vessel. The patient is asymptomatic now 2 years out from her inferior wall MI with preserved LV function. She will continue on aspirin for antiplatelet therapy, low-dose metoprolol, and atorvastatin. I did not make any medication changes today.  2. Hyperlipidemia. Her Lipitor dose was reduced about 6 months ago we will repeat lipids and LFTs. Her cholesterol is very low and this is outlined above in the history of present illness.  3. Tobacco abuse. We had a long discussion today about cessation techniques. We discussed Chantix and nicotine replacement. She may try a nicotine patch. She is not interested in Chantix at this time. She understands the implications of continued smoking. I also talked to her about the  importance of reducing Excedrin as tolerated.  For followup, I would like to see her back in 12 months.  Tonny Bollman 02/04/2012 11:11 AM

## 2012-02-20 NOTE — Addendum Note (Signed)
Addended by: Reine Just on: 02/20/2012 03:10 PM   Modules accepted: Orders

## 2012-03-02 ENCOUNTER — Other Ambulatory Visit: Payer: Self-pay | Admitting: *Deleted

## 2012-03-02 MED ORDER — ATORVASTATIN CALCIUM 20 MG PO TABS: 20.0000 mg | ORAL_TABLET | Freq: Every day | ORAL | Status: DC

## 2012-03-19 ENCOUNTER — Other Ambulatory Visit: Payer: Self-pay | Admitting: *Deleted

## 2012-03-19 ENCOUNTER — Other Ambulatory Visit (INDEPENDENT_AMBULATORY_CARE_PROVIDER_SITE_OTHER): Payer: Managed Care, Other (non HMO)

## 2012-03-19 DIAGNOSIS — E785 Hyperlipidemia, unspecified: Secondary | ICD-10-CM

## 2012-03-19 DIAGNOSIS — E7800 Pure hypercholesterolemia, unspecified: Secondary | ICD-10-CM

## 2012-03-19 DIAGNOSIS — Z79899 Other long term (current) drug therapy: Secondary | ICD-10-CM

## 2012-03-19 DIAGNOSIS — I2510 Atherosclerotic heart disease of native coronary artery without angina pectoris: Secondary | ICD-10-CM

## 2012-03-19 LAB — HEPATIC FUNCTION PANEL
ALT: 20 U/L (ref 0–35)
AST: 24 U/L (ref 0–37)
Albumin: 3.8 g/dL (ref 3.5–5.2)
Alkaline Phosphatase: 54 U/L (ref 39–117)

## 2012-03-19 LAB — LIPID PANEL
Cholesterol: 112 mg/dL (ref 0–200)
Total CHOL/HDL Ratio: 3
Triglycerides: 93 mg/dL (ref 0.0–149.0)

## 2012-03-25 ENCOUNTER — Encounter: Payer: Self-pay | Admitting: Cardiovascular Disease

## 2012-03-25 NOTE — Telephone Encounter (Signed)
This encounter was created in error - please disregard.

## 2012-03-25 NOTE — Telephone Encounter (Signed)
Pt returning nurse Lauren call, she can be reached at [phone]

## 2012-03-26 ENCOUNTER — Other Ambulatory Visit: Payer: Self-pay | Admitting: Cardiovascular Disease

## 2012-04-23 ENCOUNTER — Other Ambulatory Visit: Payer: Self-pay | Admitting: Cardiovascular Disease

## 2012-05-19 ENCOUNTER — Other Ambulatory Visit: Payer: Self-pay | Admitting: Cardiovascular Disease

## 2012-06-17 ENCOUNTER — Other Ambulatory Visit: Payer: Self-pay | Admitting: Cardiovascular Disease

## 2012-06-19 ENCOUNTER — Other Ambulatory Visit: Payer: Self-pay

## 2012-10-04 ENCOUNTER — Other Ambulatory Visit: Payer: Self-pay | Admitting: Obstetrics & Gynecology

## 2012-10-05 ENCOUNTER — Other Ambulatory Visit: Payer: Self-pay | Admitting: Cardiovascular Disease

## 2012-10-05 ENCOUNTER — Encounter: Payer: Self-pay | Admitting: Cardiovascular Disease

## 2012-10-06 ENCOUNTER — Telehealth: Payer: Self-pay

## 2012-10-06 NOTE — Telephone Encounter (Signed)
Left message on machine for pt to contact the office.   My Chart message:  I have been having some pains in my left side. They pain is about even with my left breast on the top of the rib cage under my arm. I am not sure if this is something that I need to be concerned about or not. It has been going on for a little while now and I thought that it might be worth asking about. Thank you, Chip Boer [place]

## 2012-10-08 NOTE — Telephone Encounter (Signed)
I spoke with the pt and she complained of pain under her left arm at the level of her breast.  This has been off and on for a couple of months.  The pt does not use Tylenol or NTG for these symptoms and the ache just goes away. The pt describes this as an aggravating ache and it is not related to movement. I have scheduled the pt to see Dr Excell Seltzer on 10/13/12.

## 2012-10-13 ENCOUNTER — Ambulatory Visit (INDEPENDENT_AMBULATORY_CARE_PROVIDER_SITE_OTHER): Payer: Managed Care, Other (non HMO) | Admitting: Cardiovascular Disease

## 2012-10-13 ENCOUNTER — Encounter: Payer: Self-pay | Admitting: Cardiovascular Disease

## 2012-10-13 VITALS — BP 130/82 | HR 71 | Ht 67.0 in | Wt 161.0 lb

## 2012-10-13 DIAGNOSIS — I2510 Atherosclerotic heart disease of native coronary artery without angina pectoris: Secondary | ICD-10-CM

## 2012-10-13 NOTE — Patient Instructions (Addendum)
Your physician wants you to follow-up in: October 2014 with Dr Excell Seltzer.  You will receive a reminder letter in the mail two months in advance. If you don't receive a letter, please call our office to schedule the follow-up appointment.  Your physician recommends that you continue on your current medications as directed. Please refer to the Current Medication list given to you today.

## 2012-10-15 NOTE — Progress Notes (Signed)
   HPI:  47 year old woman presenting for followup evaluation. The patient is followed for coronary artery disease. 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 She was treated with a drug-eluting stent in the RCA. Her left ventricular function has been normal. She is followed a good program of risk reduction. However, she continues to smoke cigarettes.  She called and because she became concerned about left-sided chest pain. She describes a sharp pain over the lateral aspect of the left chest. This is not affected by physical exertion. It is worse with deep inspiration and palpation. There have been no associated symptoms. She specifically denies shortness of breath, palpitations, lightheadedness, diaphoresis, or nausea she's had no fevers, chills, or cough.  Outpatient Encounter Prescriptions as of 10/13/2012  Medication Sig Dispense Refill  . aspirin 81 MG tablet Take 1 tablet (81 mg total) by mouth daily.      [name] Kitchen aspirin-acetaminophen-caffeine (EXCEDRIN MIGRAINE) 250-250-65 MG per tablet Take 2 tablets by mouth every 6 (six) hours as needed.      [name] Kitchen atorvastatin (LIPITOR) 20 MG tablet TAKE 1 TABLET BY MOUTH ONCE DAILY  30 tablet  6  . metoprolol tartrate (LOPRESSOR) 25 MG tablet TAKE 1/2 TABLET BY MOUTH TWICE DAILY  30 tablet  10  . NITROSTAT 0.4 MG SL tablet PLACE 1 TABLET UNDER THE TONGUE EVERY 5 MINUTES 3 TIMES AS NEEDED AS DIRECTED  25 tablet  4   No facility-administered encounter medications on file as of 10/13/2012.    Allergies  Allergen Reactions  . Penicillins     Past Medical History  Diagnosis Date  . CAD (coronary artery disease)     coronary intervention and drug-eluting stent placement STEMI-2011   Cath 02/25/2010:  100% RCA (PCI with Promus DES); minor LAD and CFX (nothing more than 20%)  EF 65% at cath 02/25/2010  . Hyperlipemia   . Ectopic pregnancy     (ruptured)    ROS: Negative except as per HPI  BP 130/82  Pulse 71  Ht 5\' 7"  (1.702 m)  Wt 161 lb  (73.029 kg)  BMI 25.21 kg/m2  PHYSICAL EXAM: Pt is alert and oriented, NAD HEENT: normal Neck: JVP - normal, carotids 2+= without bruits Lungs: CTA bilaterally CV: RRR without murmur or gallop Abd: soft, NT, Positive BS, no hepatomegaly Ext: no C/C/E, distal pulses intact and equal Skin: warm/dry no rash  EKG:  Normal sinus rhythm with sinus arrhythmia, rate 71 beats per minute, within normal limits.  ASSESSMENT AND PLAN: 1. Coronary artery disease, native vessel. The patient has atypical chest pain. I reassured her today. I do not think she needs any cardiac testing at this time. Her symptoms are typical for costochondritis. Recommended conservative measures. She will continue her same medical program and routine followup.  2. Tobacco abuse. We again discussed the importance of tobacco cessation considering her previous myocardial infarction.  Tonny Bollman 10/15/2012 4:32 PM

## 2012-10-20 ENCOUNTER — Encounter: Payer: Self-pay | Admitting: Gastroenterology

## 2012-10-28 ENCOUNTER — Ambulatory Visit: Payer: Managed Care, Other (non HMO) | Admitting: Gastroenterology

## 2013-02-16 ENCOUNTER — Other Ambulatory Visit: Payer: Self-pay | Admitting: Cardiovascular Disease

## 2013-03-03 ENCOUNTER — Ambulatory Visit (INDEPENDENT_AMBULATORY_CARE_PROVIDER_SITE_OTHER): Payer: Managed Care, Other (non HMO) | Admitting: Physician Assistant

## 2013-03-03 VITALS — BP 130/80 | HR 83 | Temp 98.5°F | Resp 18 | Ht 66.5 in | Wt 161.8 lb

## 2013-03-03 DIAGNOSIS — J309 Allergic rhinitis, unspecified: Secondary | ICD-10-CM

## 2013-03-03 DIAGNOSIS — J3489 Other specified disorders of nose and nasal sinuses: Secondary | ICD-10-CM

## 2013-03-03 MED ORDER — IPRATROPIUM BROMIDE 0.03 % NA SOLN: 2.0000 | Freq: Two times a day (BID) | NASAL | Status: DC

## 2013-03-03 MED ORDER — CETIRIZINE HCL 10 MG PO TABS: 10.0000 mg | ORAL_TABLET | Freq: Every day | ORAL | Status: DC

## 2013-03-03 MED ORDER — FLUTICASONE PROPIONATE 50 MCG/ACT NA SUSP: 2.0000 | Freq: Every day | NASAL | Status: DC

## 2013-03-03 NOTE — Patient Instructions (Signed)
Begin taking the cetirizine (Zyrtec) once daily  Begin using the fluticasone (Flonase) 2 sprays each nostril twice daily for 1 week, then decrease to 2 sprays each nostril once daily  Use the ipratropium (Atrovent) nasal spray 2-3 times per day to help with congestion and runny nose.  Separate this from the Flonase by about 20-30 minutes so you get the full effect of both medicines  If any symptoms are worsening (fever, facial pain, cough, shortness of breath, etc) or if in 4-5 days you are not seeing improvement, we can try an antibiotic to see if that improves symptoms   Allergic Rhinitis Allergic rhinitis is when the mucous membranes in the nose respond to allergens. Allergens are particles in the air that cause your body to have an allergic reaction. This causes you to release allergic antibodies. Through a chain of events, these eventually cause you to release histamine into the blood stream (hence the use of antihistamines). Although meant to be protective to the body, it is this release that causes your discomfort, such as frequent sneezing, congestion and an itchy runny nose.  CAUSES  The pollen allergens may come from grasses, trees, and weeds. This is seasonal allergic rhinitis, or "hay fever." Other allergens cause year-round allergic rhinitis (perennial allergic rhinitis) such as house dust mite allergen, pet dander and mold spores.  SYMPTOMS   Nasal stuffiness (congestion).  Runny, itchy nose with sneezing and tearing of the eyes.  There is often an itching of the mouth, eyes and ears. It cannot be cured, but it can be controlled with medications. DIAGNOSIS  If you are unable to determine the offending allergen, skin or blood testing may find it. TREATMENT   Avoid the allergen.  Medications and allergy shots (immunotherapy) can help.  Hay fever may often be treated with antihistamines in pill or nasal spray forms. Antihistamines block the effects of histamine. There are  over-the-counter medicines that may help with nasal congestion and swelling around the eyes. Check with your caregiver before taking or giving this medicine. If the treatment above does not work, there are many new medications your caregiver can prescribe. Stronger medications may be used if initial measures are ineffective. Desensitizing injections can be used if medications and avoidance fails. Desensitization is when a patient is given ongoing shots until the body becomes less sensitive to the allergen. Make sure you follow up with your caregiver if problems continue. SEEK MEDICAL CARE IF:   You develop fever (more than 100.5 F (38.1 C).  You develop a cough that does not stop easily (persistent).  You have shortness of breath.  You start wheezing.  Symptoms interfere with normal daily activities. Document Released: 01/14/2001 Document Revised: 07/14/2011 Document Reviewed: 07/26/2008 [facility] Patient Information 2014 [place], [place].

## 2013-03-03 NOTE — Progress Notes (Signed)
Subjective:    Patient ID: Shelby Fitzpatrick, female    DOB: 06-04-1965, 47 y.o.   MRN: [id number]  HPI   Shelby Fitzpatrick is a very pleasant 47 yr old female here with concern for illness.  Reports symptoms for about 3 wks but worse last night.  Symptoms include nasal congestion, sneezing, clear rhinorrhea.  She had to sleep upright last night due to symptoms.  She denies fever, facial pain, cough, SOB, wheezing.  Denies PND, sore throat, ear pain.  Reports that she doesn't really feel bad, but symptoms are bothersome.  Tried some afrin last night with little relief, but otherwise has not treated symptoms.  History of allergies in the remote past.  Some sick contacts at work.     Review of Systems  Constitutional: Negative for chills.  HENT: Positive for congestion, rhinorrhea and sneezing. Negative for postnasal drip.   Respiratory: Negative for wheezing.   Cardiovascular: Negative.   Gastrointestinal: Negative.   Musculoskeletal: Negative.   Skin: Negative.        Objective:   Physical Exam  Vitals reviewed. Constitutional: She is oriented to person, place, and time. She appears well-developed and well-nourished. No distress.  HENT:  Head: Normocephalic and atraumatic.  Right Ear: Tympanic membrane and ear canal normal.  Left Ear: Tympanic membrane and ear canal normal.  Nose: Mucosal edema and rhinorrhea present. Right sinus exhibits no maxillary sinus tenderness and no frontal sinus tenderness. Left sinus exhibits no maxillary sinus tenderness and no frontal sinus tenderness.  Mouth/Throat: Uvula is midline, oropharynx is clear and moist and mucous membranes are normal.  Eyes: Conjunctivae are normal. No scleral icterus.  Neck: Neck supple.  Cardiovascular: Normal rate, regular rhythm and normal heart sounds.   Pulmonary/Chest: Effort normal and breath sounds normal. She has no wheezes. She has no rales.  Lymphadenopathy:    She has no cervical adenopathy.  Neurological: She is  alert and oriented to person, place, and time.  Skin: Skin is warm and dry.  Psychiatric: She has a normal mood and affect. Her behavior is normal.        Assessment & Plan:  Allergic rhinitis - Plan: cetirizine (ZYRTEC) 10 MG tablet, fluticasone (FLONASE) 50 MCG/ACT nasal spray, ipratropium (ATROVENT) 0.03 % nasal spray  Rhinorrhea - Plan: cetirizine (ZYRTEC) 10 MG tablet, fluticasone (FLONASE) 50 MCG/ACT nasal spray, ipratropium (ATROVENT) 0.03 % nasal spray   Shelby Fitzpatrick is a very pleasant 47 yr old female here with nasal congestion, sneezing, and rhinorrhea.  She is afebrile, VSS, lungs CTA, no sinus tenderness.  Suspect allergic rhinitis may be responsible for her symptoms.  Will start zyrtec and flonase.  Atrovent nasal to help with congestion and rhinorrhea.  If worsening or no improvement in 4-5 days would consider abx to treat sinusitis - would favor doxy as pt is pen allergic.  Meds ordered this encounter  Medications  . cetirizine (ZYRTEC) 10 MG tablet    Sig: Take 1 tablet (10 mg total) by mouth daily.    Dispense:  30 tablet    Refill:  11    Order Specific Question:  Supervising Provider    Answer:  Ethelda Chick [[id number]]  . fluticasone (FLONASE) 50 MCG/ACT nasal spray    Sig: Place 2 sprays into the nose daily.    Dispense:  16 g    Refill:  12    Order Specific Question:  Supervising Provider    Answer:  Ethelda Chick [[id number]]  . ipratropium (ATROVENT)  0.03 % nasal spray    Sig: Place 2 sprays into the nose 2 (two) times daily.    Dispense:  30 mL    Refill:  1    Order Specific Question:  Supervising Provider    Answer:  Ethelda Chick [[id number]]     Loleta Dicker MHS, PA-C [facility] 10/30/201410:29 AM

## 2013-03-05 ENCOUNTER — Telehealth: Payer: Self-pay

## 2013-03-05 NOTE — Telephone Encounter (Signed)
Pt is needing to talk with someone about getting an antibodic called in   Best number [phone]

## 2013-03-06 NOTE — Telephone Encounter (Signed)
Please review notes.  Can pt get antibiotic.

## 2013-03-07 ENCOUNTER — Telehealth: Payer: Self-pay

## 2013-03-07 MED ORDER — DOXYCYCLINE HYCLATE 100 MG PO CAPS: 100.0000 mg | ORAL_CAPSULE | Freq: Two times a day (BID) | ORAL | Status: DC

## 2013-03-07 NOTE — Telephone Encounter (Signed)
Called her to advise this was sent.

## 2013-03-07 NOTE — Telephone Encounter (Signed)
Doxycycline sent to pharmacy to cover bacterial sinusitis

## 2013-03-07 NOTE — Telephone Encounter (Signed)
PT STATES SHE HAD CALLED Saturday FOR AN ANTIBIOTIC TO BE CALLED IN AND STILL HASN'T HEARD FROM ANYONE. PLEASE CALL [phone]    [facility]

## 2013-03-07 NOTE — Telephone Encounter (Signed)
Duplicate.  Antibiotic has been sent

## 2013-03-07 NOTE — Telephone Encounter (Signed)
Patient is still calling about RX on [facility] please respond

## 2013-03-10 ENCOUNTER — Other Ambulatory Visit: Payer: Self-pay

## 2013-03-14 ENCOUNTER — Other Ambulatory Visit: Payer: Self-pay | Admitting: Cardiovascular Disease

## 2013-04-09 ENCOUNTER — Other Ambulatory Visit: Payer: Self-pay | Admitting: Cardiovascular Disease

## 2013-05-09 ENCOUNTER — Other Ambulatory Visit: Payer: Self-pay | Admitting: Cardiovascular Disease

## 2013-05-11 ENCOUNTER — Other Ambulatory Visit: Payer: Self-pay | Admitting: Nurse Practitioner

## 2013-05-11 ENCOUNTER — Encounter: Payer: Self-pay | Admitting: Cardiovascular Disease

## 2013-05-11 ENCOUNTER — Ambulatory Visit (INDEPENDENT_AMBULATORY_CARE_PROVIDER_SITE_OTHER): Payer: Managed Care, Other (non HMO) | Admitting: *Deleted

## 2013-05-11 ENCOUNTER — Ambulatory Visit (INDEPENDENT_AMBULATORY_CARE_PROVIDER_SITE_OTHER): Payer: Managed Care, Other (non HMO) | Admitting: Cardiovascular Disease

## 2013-05-11 VITALS — BP 134/90 | HR 78 | Ht 66.5 in | Wt 165.1 lb

## 2013-05-11 DIAGNOSIS — E785 Hyperlipidemia, unspecified: Secondary | ICD-10-CM

## 2013-05-11 DIAGNOSIS — I2510 Atherosclerotic heart disease of native coronary artery without angina pectoris: Secondary | ICD-10-CM

## 2013-05-11 LAB — LIPID PANEL
CHOLESTEROL: 145 mg/dL (ref 0–200)
HDL: 38.4 mg/dL — ABNORMAL LOW (ref >39.00)
LDL Cholesterol: 76 mg/dL (ref 0–99)
Total CHOL/HDL Ratio: 4
Triglycerides: 152 mg/dL — ABNORMAL HIGH (ref 0.0–149.0)
VLDL: 30.4 mg/dL (ref 0.0–40.0)

## 2013-05-11 LAB — BASIC METABOLIC PANEL
BUN: 18 mg/dL (ref 6–23)
CO2: 26 mEq/L (ref 19–32)
Calcium: 9.4 mg/dL (ref 8.4–10.5)
Chloride: 103 mEq/L (ref 96–112)
Creatinine, Ser: 0.8 mg/dL (ref 0.4–1.2)
GFR: 84.54 mL/min (ref >60.00)
Glucose, Bld: 90 mg/dL (ref 70–99)
Potassium: 4.3 mEq/L (ref 3.5–5.1)
Sodium: 137 mEq/L (ref 135–145)

## 2013-05-11 LAB — HEPATIC FUNCTION PANEL
ALK PHOS: 53 U/L (ref 39–117)
ALT: 25 U/L (ref 0–35)
AST: 26 U/L (ref 0–37)
Albumin: 4.1 g/dL (ref 3.5–5.2)
BILIRUBIN TOTAL: 0.3 mg/dL (ref 0.3–1.2)
Bilirubin, Direct: 0 mg/dL (ref 0.0–0.3)
Total Protein: 7.2 g/dL (ref 6.0–8.3)

## 2013-05-11 NOTE — Progress Notes (Signed)
HPI:  48 year old woman presenting for followup evaluation. The patient is followed for coronary artery disease. 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 She was treated with a drug-eluting stent in the RCA. She had minor nonobstructive disease in the left coronary distribution. Her left ventricular function has been normal. She is followed a good program of risk reduction. However, she continues to smoke cigarettes. Labs from one year ago showed a total cholesterol of 112, triglycerides 93, HDL 44, and LDL 49. Repeat labs were done this morning and are currently pending.  She's doing well from a symptomatic perspective. She has occasional left-sided chest pain with a pleuritic component it is not related to exertion. She's had no problems recently. She denies dyspnea, edema, palpitations, lightheadedness, or syncope. Her medications have not changed. She continues to smoke about one pack of cigarettes daily. She's not been engaged in regular exercise.    Outpatient Encounter Prescriptions as of 05/11/2013  Medication Sig  . aspirin 81 MG tablet Take 1 tablet (81 mg total) by mouth daily.  [name] Kitchen aspirin-acetaminophen-caffeine (EXCEDRIN MIGRAINE) 250-250-65 MG per tablet Take 2 tablets by mouth every 6 (six) hours as needed.  [name] Kitchen atorvastatin (LIPITOR) 20 MG tablet TAKE 1 TABLET BY MOUTH DAILY  . cetirizine (ZYRTEC) 10 MG tablet Take 1 tablet (10 mg total) by mouth daily.  [name] Kitchen doxycycline (VIBRAMYCIN) 100 MG capsule Take 1 capsule (100 mg total) by mouth 2 (two) times daily.  . fluticasone (FLONASE) 50 MCG/ACT nasal spray Place 2 sprays into the nose daily.  [name] Kitchen ipratropium (ATROVENT) 0.03 % nasal spray Place 2 sprays into the nose 2 (two) times daily.  . metoprolol tartrate (LOPRESSOR) 25 MG tablet TAKE 1/2 TABLET BY MOUTH TWICE DAILY  . NITROSTAT 0.4 MG SL tablet PLACE 1 TABLET UNDER THE TONGUE EVERY 5 MINUTES 3 TIMES AS NEEDED AS DIRECTED    Allergies  Allergen Reactions  .  Penicillins     Past Medical History  Diagnosis Date  . CAD (coronary artery disease)     coronary intervention and drug-eluting stent placement STEMI-2011   Cath 02/25/2010:  100% RCA (PCI with Promus DES); minor LAD and CFX (nothing more than 20%)  EF 65% at cath 02/25/2010  . Hyperlipemia   . Ectopic pregnancy     (ruptured)    ROS: Negative except as per HPI  BP 134/90  Pulse 78  Ht 5' 6.5" (1.689 m)  Wt 165 lb 1.9 oz (74.898 kg)  BMI 26.25 kg/m2  PHYSICAL EXAM: Pt is alert and oriented, NAD HEENT: normal Neck: JVP - normal, carotids 2+= without bruits Lungs: CTA bilaterally CV: RRR without murmur or gallop Abd: soft, NT, Positive BS, no hepatomegaly Ext: no C/C/E, distal pulses intact and equal Skin: warm/dry no rash  EKG:  Normal sinus rhythm 77 beats per minute, within normal limits.  ASSESSMENT AND PLAN: 1. Coronary artery disease, native vessel. The patient is stable without symptoms of angina. Her medical program was reviewed and includes aspirin, atorvastatin, and a low-dose beta blocker. Her left ventricular function is normal after her MI. She has no symptoms of angina or congestive heart failure. Will continue her current medical program.  2. Hyperlipidemia. She remains on atorvastatin 20 mg daily. Lipids were checked this morning and we will contact her with the results.  We spent most of our time in discussion of lifestyle modification. This included strategies for tobacco cessation, initiation of an exercise program, and dietary changes. She will return  for 1 year in followup.  Sherren Mocha 05/11/2013 8:59 AM

## 2013-05-11 NOTE — Patient Instructions (Signed)
Your physician recommends that you continue on your current medications as directed. Please refer to the Current Medication list given to you today.  Your physician wants you to follow-up in: 1 year with Dr. Cooper.  You will receive a reminder letter in the mail two months in advance. If you don't receive a letter, please call our office to schedule the follow-up appointment.   

## 2013-05-12 ENCOUNTER — Ambulatory Visit: Payer: Managed Care, Other (non HMO) | Admitting: Cardiovascular Disease

## 2013-05-12 ENCOUNTER — Other Ambulatory Visit: Payer: Managed Care, Other (non HMO)

## 2013-06-26 ENCOUNTER — Other Ambulatory Visit: Payer: Self-pay | Admitting: Cardiovascular Disease

## 2013-07-18 ENCOUNTER — Other Ambulatory Visit: Payer: Self-pay | Admitting: Obstetrics & Gynecology

## 2013-09-28 ENCOUNTER — Other Ambulatory Visit: Payer: Self-pay | Admitting: Obstetrics & Gynecology

## 2013-11-08 ENCOUNTER — Other Ambulatory Visit: Payer: Self-pay | Admitting: Obstetrics & Gynecology

## 2013-11-09 LAB — CYTOLOGY - PAP

## 2014-03-07 ENCOUNTER — Encounter: Payer: Self-pay | Admitting: Cardiovascular Disease

## 2014-03-26 ENCOUNTER — Ambulatory Visit (INDEPENDENT_AMBULATORY_CARE_PROVIDER_SITE_OTHER): Payer: Managed Care, Other (non HMO) | Admitting: Internal Medicine

## 2014-03-26 VITALS — BP 144/80 | HR 91 | Temp 98.0°F | Resp 16 | Ht 67.0 in | Wt 166.0 lb

## 2014-03-26 DIAGNOSIS — J4520 Mild intermittent asthma, uncomplicated: Secondary | ICD-10-CM

## 2014-03-26 MED ORDER — PREDNISONE 20 MG PO TABS: ORAL_TABLET | ORAL | Status: DC

## 2014-03-26 MED ORDER — HYDROCODONE-HOMATROPINE 5-1.5 MG/5ML PO SYRP: 5.0000 mL | ORAL_SOLUTION | Freq: Four times a day (QID) | ORAL | Status: DC | PRN

## 2014-03-26 NOTE — Progress Notes (Signed)
   Subjective:    Patient ID: Shelby Fitzpatrick, female    DOB: Nov 26, 1965, 48 y.o.   MRN: [id number]  HPI This chart was scribed for Tami Lin, MD by Thea Alken, ED Scribe. This patient was seen in room 5 and the patient's care was started at 12:26 PM.  HPI Comments: Shelby Fitzpatrick is a 48 y.o. female who presents to the [facility] complaining of a cough onset 2 weeks. Pt states symptoms started with a sore throat, which has subsided, 2 weeks ago that persisted to a cough with associated congestion. Pt states cough worsens at night and first thing in the morning. She also has worsening congestion during the night as well.    Past Medical History  Diagnosis Date  . CAD (coronary artery disease)     coronary intervention and drug-eluting stent placement STEMI-2011   Cath 02/25/2010:  100% RCA (PCI with Promus DES); minor LAD and CFX (nothing more than 20%)  EF 65% at cath 02/25/2010  . Hyperlipemia   . Ectopic pregnancy     (ruptured)   Allergies  Allergen Reactions  . Penicillins    Prior to Admission medications   Medication Sig Start Date End Date Taking? Authorizing Provider  aspirin 81 MG tablet Take 1 tablet (81 mg total) by mouth daily. 07/29/10  Yes Sherren Mocha, MD  aspirin-acetaminophen-caffeine (EXCEDRIN MIGRAINE) [phone] MG per tablet Take 2 tablets by mouth every 6 (six) hours as needed.   Yes Historical Provider, MD  atorvastatin (LIPITOR) 20 MG tablet TAKE 1 TABLET BY MOUTH EVERY DAY   Yes Sherren Mocha, MD  fluticasone (FLONASE) 50 MCG/ACT nasal spray Place 2 sprays into the nose daily. 03/03/13  Yes Theda Sers, PA-C  metoprolol tartrate (LOPRESSOR) 25 MG tablet TAKE 1/2 TABLET BY MOUTH TWICE DAILY   Yes Sherren Mocha, MD  NITROSTAT 0.4 MG SL tablet PLACE 1 TABLET UNDER THE TONGUE EVERY 5 MINUTES 3 TIMES AS NEEDED AS DIRECTED 10/05/12  Yes Sherren Mocha, MD   Review of Systems  HENT: Positive for congestion and sore throat.     Objective:   Physical Exam  Constitutional: She is oriented to person, place, and time. She appears well-developed and well-nourished. No distress.  HENT:  Head: Normocephalic and atraumatic.  Right Ear: External ear normal.  Left Ear: External ear normal.  Nose: Nose normal.  Mouth/Throat: Oropharynx is clear and moist.  Eyes: Conjunctivae and EOM are normal.  Neck: Neck supple.  Cardiovascular: Normal rate.   Pulmonary/Chest: Effort normal. She has wheezes ( on forced expiration bilaterally).  Musculoskeletal: Normal range of motion.  Neurological: She is alert and oriented to person, place, and time.  Skin: Skin is warm and dry.  Psychiatric: She has a normal mood and affect. Her behavior is normal.  Nursing note and vitals reviewed.  Assessment & Plan:  Reactive airway disease secondary to recent infection  Meds ordered this encounter  Medications  . predniSONE (DELTASONE) 20 MG tablet    Sig: 3/3/2/2/1/1 single daily dose for 6 days    Dispense:  12 tablet    Refill:  0  . HYDROcodone-homatropine (HYCODAN) 5-1.5 MG/5ML syrup    Sig: Take 5 mLs by mouth every 6 (six) hours as needed.    Dispense:  120 mL    Refill:  0  I have completed the patient encounter in its entirety as documented by the scribe, with editing by me where necessary. Robert P. Laney Pastor, M.D.

## 2014-04-04 ENCOUNTER — Ambulatory Visit (INDEPENDENT_AMBULATORY_CARE_PROVIDER_SITE_OTHER): Payer: Managed Care, Other (non HMO) | Admitting: Family Medicine

## 2014-04-04 VITALS — BP 138/90 | HR 78 | Temp 97.9°F | Resp 16 | Ht 67.5 in | Wt 165.0 lb

## 2014-04-04 DIAGNOSIS — J22 Unspecified acute lower respiratory infection: Secondary | ICD-10-CM

## 2014-04-04 DIAGNOSIS — J988 Other specified respiratory disorders: Secondary | ICD-10-CM

## 2014-04-04 DIAGNOSIS — J329 Chronic sinusitis, unspecified: Secondary | ICD-10-CM

## 2014-04-04 DIAGNOSIS — J40 Bronchitis, not specified as acute or chronic: Secondary | ICD-10-CM

## 2014-04-04 MED ORDER — AZITHROMYCIN 250 MG PO TABS: ORAL_TABLET | ORAL | Status: DC

## 2014-04-04 NOTE — Progress Notes (Signed)
Subjective:    Patient ID: Shelby Fitzpatrick, female    DOB: 1966-04-11, 48 y.o.   MRN: [id number]  This chart was scribed for Shelby Ray, MD by Stephania Fragmin, ED Scribe. This patient was seen in room 14 and the patient's care was started at 2:44 PM.   HPI  HPI Comments: Shelby Fitzpatrick is a 48 y.o. female who presents to [facility] for an illness recheck. She was seen by Dr. Laney Pastor here 9 days ago for a cough and reactive airway. She was noted to have wheezing on exam at that time. Patient was treated with a 6-day Prednisone taper and hydrocodone cough syrup at night if needed.   Patient said her symptoms improved somewhat for a couple days, but her symptoms resumed shortly thereafter and as of 3 days ago, her nasal congestion and chest congestion has returned. She confirms a little wheezing. Patient had asked for Z-pak during her last visit with Dr. Laney Pastor, but she was told that it wasn't needed for that time. On Sunday, she experienced a low-grade, <100 degree fever, but it has since resolved. Patient has not used any OTC medicine for her symptoms besides Excedrin for headache. Patient's daughter had an illness that started around the same time as patient. Patient denies a history of asthma and says that "it's been a long time since she's had a problem with allergies." She also denies congestion around her sinuses.   Patient Active Problem List   Diagnosis Date Noted  . Nicotine addiction 01/18/2012  . HYPERLIPIDEMIA 03/13/2010  . 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 CORONARY ATHEROSCLEROSIS NATIVE CORONARY ARTERY 03/13/2010  . DIZZINESS 03/13/2010   Past Medical History  Diagnosis Date  . CAD (coronary artery disease)     coronary intervention and drug-eluting stent placement STEMI-2011   Cath 02/25/2010:  100% RCA (PCI with Promus DES); minor LAD and CFX (nothing more than 20%)  EF 65% at cath 02/25/2010  . Hyperlipemia   . Ectopic pregnancy     (ruptured)   Past Surgical History  Procedure Laterality Date  . Coronary intervention       drug-eluting stent placement-2011   Allergies  Allergen Reactions  . Penicillins    Prior to Admission medications   Medication Sig Start Date End Date Taking? Authorizing Provider  aspirin 81 MG tablet Take 1 tablet (81 mg total) by mouth daily. 07/29/10  Yes Sherren Mocha, MD  aspirin-acetaminophen-caffeine (EXCEDRIN MIGRAINE) [phone] MG per tablet Take 2 tablets by mouth every 6 (six) hours as needed.   Yes Historical Provider, MD  atorvastatin (LIPITOR) 20 MG tablet TAKE 1 TABLET BY MOUTH EVERY DAY   Yes Sherren Mocha, MD  fluticasone (FLONASE) 50 MCG/ACT nasal spray Place 2 sprays into the nose daily. 03/03/13  Yes Eleanore E Egan, PA-C  HYDROcodone-homatropine (HYCODAN) 5-1.5 MG/5ML syrup Take 5 mLs by mouth every 6 (six) hours as needed. 03/26/14  Yes Leandrew Koyanagi, MD  metoprolol tartrate (LOPRESSOR) 25 MG tablet TAKE 1/2 TABLET BY MOUTH TWICE DAILY   Yes Sherren Mocha, MD  NITROSTAT 0.4 MG SL tablet PLACE 1 TABLET UNDER THE TONGUE EVERY 5 MINUTES 3 TIMES AS NEEDED AS DIRECTED 10/05/12  Yes Sherren Mocha, MD  predniSONE (DELTASONE) 20 MG tablet 3/3/2/2/1/1 single daily dose for 6 days 03/26/14  Yes Leandrew Koyanagi, MD  azithromycin (ZITHROMAX) 250 MG tablet Take 2 pills by mouth on day 1, then 1 pill by mouth per day on days 2 through  5. 04/04/14   Wendie Agreste, MD   History   Social History  . Marital Status: Married    Spouse Name: N/A    Number of Children: N/A  . Years of Education: N/A   Occupational History  . Not on file.   Social History Main Topics  . Smoking status: Current Every Day Smoker    Types: Cigarettes  . Smokeless tobacco: Not on file  . Alcohol Use: No  . Drug Use: No  . Sexual Activity: Not on file   Other Topics Concern  . Not on file   Social History Narrative      Review of Systems     Objective:   Physical Exam    Constitutional: She is oriented to person, place, and time. She appears well-developed and well-nourished. No distress.  HENT:  Head: Normocephalic and atraumatic.  Right Ear: Hearing, tympanic membrane, external ear and ear canal normal.  Left Ear: Hearing, tympanic membrane, external ear and ear canal normal.  Nose: Nose normal.  Mouth/Throat: Oropharynx is clear and moist. No oropharyngeal exudate.  Sinuses are non-tender.  Eyes: Conjunctivae and EOM are normal. Pupils are equal, round, and reactive to light.  Cardiovascular: Normal rate, regular rhythm, normal heart sounds and intact distal pulses.   No murmur heard. Pulmonary/Chest: Effort normal and breath sounds normal. No respiratory distress. She has no wheezes. She has no rhonchi.  Lungs are clear to auscultation.  Neurological: She is alert and oriented to person, place, and time.  Skin: Skin is warm and dry. No rash noted.  Psychiatric: She has a normal mood and affect. Her behavior is normal.  Vitals reviewed.   Filed Vitals:   04/04/14 1326  BP: 138/90  Pulse: 78  Temp: 97.9 F (36.6 C)  TempSrc: Oral  Resp: 16  Height: 5' 7.5" (1.715 m)  Weight: 165 lb (74.844 kg)         Assessment & Plan:   [name] is a 48 y.o. female Sinobronchitis - Plan: azithromycin (ZITHROMAX) 250 MG tablet  LRTI (lower respiratory tract infection) - Plan: azithromycin (ZITHROMAX) 250 MG tablet  -persistent cough, may have been viral initially, but with initial wheeze (now resolved), atypicals such as mycoplasma possible. Sinobronchitis at this point.   -start Zpak, sx care with saline NS, mucinex or mucinex DM, RTC precautions discussed including return for XR if not improving with Zpak, or worsening sooner.   Meds ordered this encounter  Medications  . azithromycin (ZITHROMAX) 250 MG tablet    Sig: Take 2 pills by mouth on day 1, then 1 pill by mouth per day on days 2 through 5.    Dispense:  6 tablet    Refill:  0    Patient Instructions  Saline nasal spray atleast 4 times per day, over the counter mucinex or mucinex DM, drink plenty of fluids.  If cough not improving in next 2 weeks, return for xray. Return to the clinic or go to the nearest emergency room if any of your symptoms worsen or new symptoms occur.   Acute Bronchitis Bronchitis is inflammation of the airways that extend from the windpipe into the lungs (bronchi). The inflammation often causes mucus to develop. This leads to a cough, which is the most common symptom of bronchitis.  In acute bronchitis, the condition usually develops suddenly and goes away over time, usually in a couple weeks. Smoking, allergies, and asthma can make bronchitis worse. Repeated episodes of bronchitis may cause further lung  problems.  CAUSES Acute bronchitis is most often caused by the same virus that causes a cold. The virus can spread from person to person (contagious) through coughing, sneezing, and touching contaminated objects. SIGNS AND SYMPTOMS   Cough.   Fever.   Coughing up mucus.   Body aches.   Chest congestion.   Chills.   Shortness of breath.   Sore throat.  DIAGNOSIS  Acute bronchitis is usually diagnosed through a physical exam. Your health care provider will also ask you questions about your medical history. Tests, such as chest X-rays, are sometimes done to rule out other conditions.  TREATMENT  Acute bronchitis usually goes away in a couple weeks. Oftentimes, no medical treatment is necessary. Medicines are sometimes given for relief of fever or cough. Antibiotic medicines are usually not needed but may be prescribed in certain situations. In some cases, an inhaler may be recommended to help reduce shortness of breath and control the cough. A cool mist vaporizer may also be used to help thin bronchial secretions and make it easier to clear the chest.  HOME CARE INSTRUCTIONS  Get plenty of rest.   Drink enough fluids to keep  your urine clear or pale yellow (unless you have a medical condition that requires fluid restriction). Increasing fluids may help thin your respiratory secretions (sputum) and reduce chest congestion, and it will prevent dehydration.   Take medicines only as directed by your health care provider.  If you were prescribed an antibiotic medicine, finish it all even if you start to feel better.  Avoid smoking and secondhand smoke. Exposure to cigarette smoke or irritating chemicals will make bronchitis worse. If you are a smoker, consider using nicotine gum or skin patches to help control withdrawal symptoms. Quitting smoking will help your lungs heal faster.   Reduce the chances of another bout of acute bronchitis by washing your hands frequently, avoiding people with cold symptoms, and trying not to touch your hands to your mouth, nose, or eyes.   Keep all follow-up visits as directed by your health care provider.  SEEK MEDICAL CARE IF: Your symptoms do not improve after 1 week of treatment.  SEEK IMMEDIATE MEDICAL CARE IF:  You develop an increased fever or chills.   You have chest pain.   You have severe shortness of breath.  You have bloody sputum.   You develop dehydration.  You faint or repeatedly feel like you are going to pass out.  You develop repeated vomiting.  You develop a severe headache. MAKE SURE YOU:   Understand these instructions.  Will watch your condition.  Will get help right away if you are not doing well or get worse. Document Released: 05/29/2004 Document Revised: 09/05/2013 Document Reviewed: 10/12/2012 [facility] Patient Information 2015 [employer], [place]. This information is not intended to replace advice given to you by your health care provider. Make sure you discuss any questions you have with your health care provider.     I personally performed the services described in this documentation, which was scribed in my presence. The recorded  information has been reviewed and considered, and addended by me as needed.

## 2014-04-04 NOTE — Patient Instructions (Signed)
Saline nasal spray atleast 4 times per day, over the counter mucinex or mucinex DM, drink plenty of fluids.  If cough not improving in next 2 weeks, return for xray. Return to the clinic or go to the nearest emergency room if any of your symptoms worsen or new symptoms occur.   Acute Bronchitis Bronchitis is inflammation of the airways that extend from the windpipe into the lungs (bronchi). The inflammation often causes mucus to develop. This leads to a cough, which is the most common symptom of bronchitis.  In acute bronchitis, the condition usually develops suddenly and goes away over time, usually in a couple weeks. Smoking, allergies, and asthma can make bronchitis worse. Repeated episodes of bronchitis may cause further lung problems.  CAUSES Acute bronchitis is most often caused by the same virus that causes a cold. The virus can spread from person to person (contagious) through coughing, sneezing, and touching contaminated objects. SIGNS AND SYMPTOMS   Cough.   Fever.   Coughing up mucus.   Body aches.   Chest congestion.   Chills.   Shortness of breath.   Sore throat.  DIAGNOSIS  Acute bronchitis is usually diagnosed through a physical exam. Your health care provider will also ask you questions about your medical history. Tests, such as chest X-rays, are sometimes done to rule out other conditions.  TREATMENT  Acute bronchitis usually goes away in a couple weeks. Oftentimes, no medical treatment is necessary. Medicines are sometimes given for relief of fever or cough. Antibiotic medicines are usually not needed but may be prescribed in certain situations. In some cases, an inhaler may be recommended to help reduce shortness of breath and control the cough. A cool mist vaporizer may also be used to help thin bronchial secretions and make it easier to clear the chest.  HOME CARE INSTRUCTIONS  Get plenty of rest.   Drink enough fluids to keep your urine clear or pale  yellow (unless you have a medical condition that requires fluid restriction). Increasing fluids may help thin your respiratory secretions (sputum) and reduce chest congestion, and it will prevent dehydration.   Take medicines only as directed by your health care provider.  If you were prescribed an antibiotic medicine, finish it all even if you start to feel better.  Avoid smoking and secondhand smoke. Exposure to cigarette smoke or irritating chemicals will make bronchitis worse. If you are a smoker, consider using nicotine gum or skin patches to help control withdrawal symptoms. Quitting smoking will help your lungs heal faster.   Reduce the chances of another bout of acute bronchitis by washing your hands frequently, avoiding people with cold symptoms, and trying not to touch your hands to your mouth, nose, or eyes.   Keep all follow-up visits as directed by your health care provider.  SEEK MEDICAL CARE IF: Your symptoms do not improve after 1 week of treatment.  SEEK IMMEDIATE MEDICAL CARE IF:  You develop an increased fever or chills.   You have chest pain.   You have severe shortness of breath.  You have bloody sputum.   You develop dehydration.  You faint or repeatedly feel like you are going to pass out.  You develop repeated vomiting.  You develop a severe headache. MAKE SURE YOU:   Understand these instructions.  Will watch your condition.  Will get help right away if you are not doing well or get worse. Document Released: 05/29/2004 Document Revised: 09/05/2013 Document Reviewed: 10/12/2012 [employer] Patient Information 2015 [place]. This information is not intended to replace advice given to you by your health care provider. Make sure you discuss any questions you have with your health care provider.  

## 2014-05-16 ENCOUNTER — Ambulatory Visit: Payer: Managed Care, Other (non HMO) | Admitting: Cardiovascular Disease

## 2014-06-10 ENCOUNTER — Other Ambulatory Visit: Payer: Self-pay | Admitting: Cardiovascular Disease

## 2014-06-27 ENCOUNTER — Ambulatory Visit (INDEPENDENT_AMBULATORY_CARE_PROVIDER_SITE_OTHER): Payer: Managed Care, Other (non HMO) | Admitting: Cardiovascular Disease

## 2014-06-27 ENCOUNTER — Other Ambulatory Visit: Payer: Self-pay | Admitting: *Deleted

## 2014-06-27 ENCOUNTER — Encounter: Payer: Self-pay | Admitting: Cardiovascular Disease

## 2014-06-27 VITALS — BP 140/88 | HR 65 | Ht 67.5 in | Wt 157.0 lb

## 2014-06-27 DIAGNOSIS — E785 Hyperlipidemia, unspecified: Secondary | ICD-10-CM

## 2014-06-27 DIAGNOSIS — I2510 Atherosclerotic heart disease of native coronary artery without angina pectoris: Secondary | ICD-10-CM

## 2014-06-27 MED ORDER — ATORVASTATIN CALCIUM 20 MG PO TABS: 20.0000 mg | ORAL_TABLET | Freq: Every day | ORAL | Status: DC

## 2014-06-27 MED ORDER — METOPROLOL TARTRATE 25 MG PO TABS: 25.0000 mg | ORAL_TABLET | Freq: Two times a day (BID) | ORAL | Status: DC

## 2014-06-27 NOTE — Patient Instructions (Signed)
Your physician recommends that you return for a FASTING LIPID and LIVER profile--nothing to eat or drink after midnight, lab opens at 7:30 AM  Your physician has recommended you make the following change in your medication:  1. INCREASE Metoprolol Tartrate to 25mg  take one by mouth twice a day  Your physician wants you to follow-up in: 1 YEAR with Dr Burt Knack. You will receive a reminder letter in the mail two months in advance. If you don't receive a letter, please call our office to schedule the follow-up appointment.

## 2014-06-27 NOTE — Progress Notes (Signed)
Cardiology Office Note   Date:  06/27/2014   ID:  Kate Larock, DOB 1965-11-09, MRN [id number]  PCP:  No PCP Per Patient  Cardiologist:  Sherren Mocha, MD    No chief complaint on file.    History of Present Illness: Shelby Fitzpatrick is a 49 y.o. female who presents for follow-up evaluation.  The patient is followed for coronary artery disease. 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 She was treated with a drug-eluting stent in the RCA. She had minor nonobstructive disease in the left coronary distribution. Her left ventricular function has been normal. She is followed a good program of risk reduction.    Comes in today for one year follow-up. Notes BP has been mildly elevated when checking at home and consistently over 140 mmHg. Had an episode at work with left scapular pain, felt like a 'knife' and lasted only 30 seconds. No recurrence and no exertional symptoms. No shortness of breath or palpitations.   The patient has had a lot of stress at home. She had cut back on cigarettes but is back to her baseline and still smoking.  Past Medical History  Diagnosis Date  . CAD (coronary artery disease)     coronary intervention and drug-eluting stent placement STEMI-2011   Cath 02/25/2010:  100% RCA (PCI with Promus DES); minor LAD and CFX (nothing more than 20%)  EF 65% at cath 02/25/2010  . Hyperlipemia   . Ectopic pregnancy     (ruptured)    Past Surgical History  Procedure Laterality Date  . Coronary intervention       drug-eluting stent placement-2011    Current Outpatient Prescriptions  Medication Sig Dispense Refill  . aspirin 81 MG tablet Take 1 tablet (81 mg total) by mouth daily.    [name] Kitchen aspirin-acetaminophen-caffeine (EXCEDRIN MIGRAINE) 250-250-65 MG per tablet Take 2 tablets by mouth every 6 (six) hours as needed.    [name] Kitchen atorvastatin (LIPITOR) 20 MG tablet TAKE 1 TABLET BY MOUTH DAILY 30 tablet 0  . metoprolol tartrate (LOPRESSOR) 25 MG tablet Take 1  tablet (25 mg total) by mouth 2 (two) times daily. 60 tablet 11  . NITROSTAT 0.4 MG SL tablet PLACE 1 TABLET UNDER THE TONGUE EVERY 5 MINUTES 3 TIMES AS NEEDED AS DIRECTED 25 tablet 4   No current facility-administered medications for this visit.    Allergies:   Penicillins   Social History:  The patient  reports that she has been smoking Cigarettes.  She does not have any smokeless tobacco history on file. She reports that she does not drink alcohol or use illicit drugs.   Family History:  The patient's family history includes Coronary artery disease in her father; Heart attack in her father.    ROS:  Please see the history of present illness.  Otherwise, review of systems is positive for headaches.  All other systems are reviewed and negative.    PHYSICAL EXAM: VS:  BP 140/88 mmHg  Pulse 65  Ht 5' 7.5" (1.715 m)  Wt 157 lb (71.215 kg)  BMI 24.21 kg/m2 , BMI Body mass index is 24.21 kg/(m^2). GEN: Well nourished, well developed, in no acute distress HEENT: normal Neck: no JVD, no masses, no carotid bruits Cardiac: RRR without murmur or gallop                Respiratory:  clear to auscultation bilaterally, normal work of breathing GI: soft, nontender, nondistended, + BS MS: no deformity or atrophy Ext: no  pretibial edema Skin: warm and dry, no rash Neuro:  Strength and sensation are intact Psych: euthymic mood, full affect  EKG:  EKG is ordered today. The ekg ordered today shows NSR 65 bpm, within normal limits.  Recent Labs: No results found for requested labs within last 365 days.   Lipid Panel     Component Value Date/Time   CHOL 145 05/11/2013 0944   TRIG 152.0* 05/11/2013 0944   HDL 38.40* 05/11/2013 0944   CHOLHDL 4 05/11/2013 0944   VLDL 30.4 05/11/2013 0944   LDLCALC 76 05/11/2013 0944      Wt Readings from Last 3 Encounters:  06/27/14 157 lb (71.215 kg)  04/04/14 165 lb (74.844 kg)  03/26/14 166 lb (75.297 kg)     ASSESSMENT AND PLAN: 1.  Coronary  artery disease, native vessel. One episode of atypical upper back pain. This does not sound heart related. She otherwise is asymptomatic. Her EKG is within normal limits. Will continue her current program of aspirin, beta-blockade, and atorvastatin.  2. Hyperlipidemia: Last year's lipids reviewed. Patient is on a statin drug. Will schedule lipids and LFTs.  3. Essential hypertension, suboptimal control. Reviewed sodium restriction. Will increase metoprolol to 25 mg twice daily. She will monitor blood pressures at home periodically.   Current medicines are reviewed with the patient today.  The patient does not have concerns regarding medicines.  The following changes have been made:   Increase metoprolol to 25 mg twice daily  Labs/ tests ordered today include:   Orders Placed This Encounter  Procedures  . Lipid panel  . Hepatic function panel  . EKG 12-Lead   Disposition:   FU one year  Signed, Sherren Mocha, MD  06/27/2014 9:18 AM    [facility] [address], [place], [place]  [address] Phone: [phone]; Fax: [phone]

## 2014-06-28 ENCOUNTER — Other Ambulatory Visit (INDEPENDENT_AMBULATORY_CARE_PROVIDER_SITE_OTHER): Payer: Managed Care, Other (non HMO) | Admitting: *Deleted

## 2014-06-28 DIAGNOSIS — E785 Hyperlipidemia, unspecified: Secondary | ICD-10-CM

## 2014-06-28 DIAGNOSIS — I2510 Atherosclerotic heart disease of native coronary artery without angina pectoris: Secondary | ICD-10-CM

## 2014-06-28 LAB — LIPID PANEL
CHOLESTEROL: 124 mg/dL (ref 0–200)
HDL: 41.9 mg/dL (ref >39.00)
LDL Cholesterol: 62 mg/dL (ref 0–99)
NonHDL: 82.1
TRIGLYCERIDES: 100 mg/dL (ref 0.0–149.0)
Total CHOL/HDL Ratio: 3
VLDL: 20 mg/dL (ref 0.0–40.0)

## 2014-06-28 LAB — HEPATIC FUNCTION PANEL
ALT: 16 U/L (ref 0–35)
AST: 20 U/L (ref 0–37)
Albumin: 3.9 g/dL (ref 3.5–5.2)
Alkaline Phosphatase: 46 U/L (ref 39–117)
Bilirubin, Direct: 0.1 mg/dL (ref 0.0–0.3)
TOTAL PROTEIN: 6.4 g/dL (ref 6.0–8.3)
Total Bilirubin: 0.4 mg/dL (ref 0.2–1.2)

## 2014-06-30 ENCOUNTER — Ambulatory Visit: Payer: Managed Care, Other (non HMO) | Admitting: Cardiovascular Disease

## 2014-08-22 ENCOUNTER — Telehealth: Payer: Self-pay | Admitting: Cardiovascular Disease

## 2014-08-22 NOTE — Telephone Encounter (Signed)
New Message    Patient states that when she lays flat anywhere she get vertigo. The patient states is started on Saturday, no other symptoms with the vertigo.  Please give patient a call.

## 2014-08-22 NOTE — Telephone Encounter (Signed)
I spoke with the pt and she states that since Saturday she has developed vertigo symptoms when lying down with her head in a certain position. The pt denies ear or sinus issues at this time.  I advised the pt that she can try meclizine or dramamine for symptoms and avoid position that causes vertigo. Pt agreed with plan.

## 2014-08-29 ENCOUNTER — Encounter: Payer: Self-pay | Admitting: Physician Assistant

## 2014-08-29 ENCOUNTER — Ambulatory Visit (INDEPENDENT_AMBULATORY_CARE_PROVIDER_SITE_OTHER): Payer: Managed Care, Other (non HMO) | Admitting: Physician Assistant

## 2014-08-29 VITALS — BP 98/68 | HR 90 | Temp 98.0°F | Resp 16 | Ht 67.0 in | Wt 152.0 lb

## 2014-08-29 DIAGNOSIS — R42 Dizziness and giddiness: Secondary | ICD-10-CM | POA: Diagnosis not present

## 2014-08-29 MED ORDER — MECLIZINE HCL 25 MG PO TABS: 25.0000 mg | ORAL_TABLET | Freq: Three times a day (TID) | ORAL | Status: DC | PRN

## 2014-08-29 NOTE — Progress Notes (Signed)
Patient presents to clinic today c/o intermittent episodes of dizziness over the past 9 days, lasting < 1 minute each, associated with changes in position of her head.  Denies trauma, injury, lightheadedness, LOC or vision changes.  Endorses some ear pressure and runny nose but no other URI symptoms.  Denies chest pain or palpitations. Symptoms are improving but are still present.  Past Medical History  Diagnosis Date  . CAD (coronary artery disease)     coronary intervention and drug-eluting stent placement STEMI-2011   Cath 02/25/2010:  100% RCA (PCI with Promus DES); minor LAD and CFX (nothing more than 20%)  EF 65% at cath 02/25/2010  . Hyperlipemia   . Ectopic pregnancy     (ruptured)    Current Outpatient Prescriptions on File Prior to Visit  Medication Sig Dispense Refill  . aspirin 81 MG tablet Take 1 tablet (81 mg total) by mouth daily.    [name] Kitchen aspirin-acetaminophen-caffeine (EXCEDRIN MIGRAINE) 250-250-65 MG per tablet Take 2 tablets by mouth every 6 (six) hours as needed.    [name] Kitchen atorvastatin (LIPITOR) 20 MG tablet Take 1 tablet (20 mg total) by mouth daily. 30 tablet 11  . metoprolol tartrate (LOPRESSOR) 25 MG tablet Take 1 tablet (25 mg total) by mouth 2 (two) times daily. 60 tablet 11  . NITROSTAT 0.4 MG SL tablet PLACE 1 TABLET UNDER THE TONGUE EVERY 5 MINUTES 3 TIMES AS NEEDED AS DIRECTED 25 tablet 4   No current facility-administered medications on file prior to visit.    Allergies  Allergen Reactions  . Penicillins     Family History  Problem Relation Age of Onset  . Coronary artery disease Father   . Heart attack Father     History   Social History  . Marital Status: Married    Spouse Name: N/A  . Number of Children: N/A  . Years of Education: N/A   Social History Main Topics  . Smoking status: Current Every Day Smoker    Types: Cigarettes  . Smokeless tobacco: Never Used  . Alcohol Use: 0.0 oz/week    0 Standard drinks or equivalent per week   Comment: rare  . Drug Use: No  . Sexual Activity: Not on file   Other Topics Concern  . None   Social History Narrative   Review of Systems - See HPI.  All other ROS are negative.  BP 98/68 mmHg  Pulse 90  Temp(Src) 98 F (36.7 C) (Oral)  Resp 16  Ht 5\' 7"  (1.702 m)  Wt 152 lb (68.947 kg)  BMI 23.80 kg/m2  SpO2 99%  Physical Exam  Constitutional: She is oriented to person, place, and time and well-developed, well-nourished, and in no distress.  HENT:  Head: Normocephalic and atraumatic.  Right Ear: A middle ear effusion is present.  Left Ear: A middle ear effusion is present.  Nose: Nose normal.  Mouth/Throat: Uvula is midline and oropharynx is clear and moist.  Dix Hallpike maneuver negative.  Eyes: Conjunctivae and EOM are normal. Pupils are equal, round, and reactive to light.  Neck: Neck supple. No thyromegaly present.  Cardiovascular: Normal rate, regular rhythm, normal heart sounds and intact distal pulses.   Pulmonary/Chest: Effort normal. No respiratory distress. She has no wheezes. She has no rales. She exhibits no tenderness.  Lymphadenopathy:    She has no cervical adenopathy.  Neurological: She is alert and oriented to person, place, and time.  Skin: Skin is warm and dry. No rash noted.  Psychiatric: Affect  normal.  Vitals reviewed.   Recent Results (from the past 2160 hour(s))  Lipid panel     Status: None   Collection Time: 06/28/14  7:47 AM  Result Value Ref Range   Cholesterol 124 0 - 200 mg/dL    Comment: ATP III Classification       Desirable:  < 200 mg/dL               Borderline High:  200 - 239 mg/dL          High:  > = 240 mg/dL   Triglycerides 100.0 0.0 - 149.0 mg/dL    Comment: Normal:  <150 mg/dLBorderline High:  150 - 199 mg/dL   HDL 41.90 >39.00 mg/dL   VLDL 20.0 0.0 - 40.0 mg/dL   LDL Cholesterol 62 0 - 99 mg/dL   Total CHOL/HDL Ratio 3     Comment:                Men          Women1/2 Average Risk     3.4          3.3Average Risk           5.0          4.42X Average Risk          9.6          7.13X Average Risk          15.0          11.0                       NonHDL 82.10     Comment: NOTE:  Non-HDL goal should be 30 mg/dL higher than patient's LDL goal (i.e. LDL goal of < 70 mg/dL, would have non-HDL goal of < 100 mg/dL)  Hepatic function panel     Status: None   Collection Time: 06/28/14  7:47 AM  Result Value Ref Range   Total Bilirubin 0.4 0.2 - 1.2 mg/dL   Bilirubin, Direct 0.1 0.0 - 0.3 mg/dL   Alkaline Phosphatase 46 39 - 117 U/L   AST 20 0 - 37 U/L   ALT 16 0 - 35 U/L   Total Protein 6.4 6.0 - 8.3 g/dL   Albumin 3.9 3.5 - 5.2 g/dL    Assessment/Plan: Dizziness EKG reveals NSR. OVS negative. BP low end of normal, encouraged increase hydration and one Gatorade per day. Rx Meclizine. Restart Flonase taking daily. Positional vertigo versus ETD. Epley maneuvers discussed.  Handout given.

## 2014-08-29 NOTE — Assessment & Plan Note (Signed)
EKG reveals NSR. OVS negative. BP low end of normal, encouraged increase hydration and one Gatorade per day. Rx Meclizine. Restart Flonase taking daily. Positional vertigo versus ETD. Epley maneuvers discussed.  Handout given.

## 2014-08-29 NOTE — Progress Notes (Signed)
Pre visit review using our clinic review tool, if applicable. No additional management support is needed unless otherwise documented below in the visit note. 

## 2014-08-29 NOTE — Patient Instructions (Signed)
Please take meclizine as directed if needed for dizziness. Start your flonase daily and begin a daily claritin. Stay well hydrated and make sure to add one gatorade per day. Follow the instructions below to help with dizziness. Follow-up in 1 week.  Epley Maneuver Self-Care WHAT IS THE EPLEY MANEUVER? The Epley maneuver is an exercise you can do to relieve symptoms of benign paroxysmal positional vertigo (BPPV). This condition is often just referred to as vertigo. BPPV is caused by the movement of tiny crystals (canaliths) inside your inner ear. The accumulation and movement of canaliths in your inner ear causes a sudden spinning sensation (vertigo) when you move your head to certain positions. Vertigo usually lasts about 30 seconds. BPPV usually occurs in just one ear. If you get vertigo when you lie on your left side, you probably have BPPV in your left ear. Your health care provider can tell you which ear is involved.  BPPV may be caused by a head injury. Many people older than 50 get BPPV for unknown reasons. If you have been diagnosed with BPPV, your health care provider may teach you how to do this maneuver. BPPV is not life threatening (benign) and usually goes away in time.  WHEN SHOULD I PERFORM THE EPLEY MANEUVER? You can do this maneuver at home whenever you have symptoms of vertigo. You may do the Epley maneuver up to 3 times a day until your symptoms of vertigo go away. HOW SHOULD I DO THE EPLEY MANEUVER? 1. Sit on the edge of a bed or table with your back straight. Your legs should be extended or hanging over the edge of the bed or table.  2. Turn your head halfway toward the affected ear.  3. Lie backward quickly with your head turned until you are lying flat on your back. You may want to position a pillow under your shoulders.  4. Hold this position for 30 seconds. You may experience an attack of vertigo. This is normal. Hold this position until the vertigo stops. 5. Then turn  your head to the opposite direction until your unaffected ear is facing the floor.  6. Hold this position for 30 seconds. You may experience an attack of vertigo. This is normal. Hold this position until the vertigo stops. 7. Now turn your whole body to the same side as your head. Hold for another 30 seconds.  8. You can then sit back up. ARE THERE RISKS TO THIS MANEUVER? In some cases, you may have other symptoms (such as changes in your vision, weakness, or numbness). If you have these symptoms, stop doing the maneuver and call your health care provider. Even if doing these maneuvers relieves your vertigo, you may still have dizziness. Dizziness is the sensation of light-headedness but without the sensation of movement. Even though the Epley maneuver may relieve your vertigo, it is possible that your symptoms will return within 5 years. WHAT SHOULD I DO AFTER THIS MANEUVER? After doing the Epley maneuver, you can return to your normal activities. Ask your doctor if there is anything you should do at home to prevent vertigo. This may include:  Sleeping with two or more pillows to keep your head elevated.  Not sleeping on the side of your affected ear.  Getting up slowly from bed.  Avoiding sudden movements during the day.  Avoiding extreme head movement, like looking up or bending over.  Wearing a cervical collar to prevent sudden head movements. WHAT SHOULD I DO IF MY SYMPTOMS GET  WORSE? Call your health care provider if your vertigo gets worse. Call your provider right way if you have other symptoms, including:   Nausea.  Vomiting.  Headache.  Weakness.  Numbness.  Vision changes. Document Released: 04/26/2013 Document Reviewed: 04/26/2013 [facility] Patient Information 2015 [place], [place]. This information is not intended to replace advice given to you by your health care provider. Make sure you discuss any questions you have with your health care provider.

## 2014-09-05 ENCOUNTER — Ambulatory Visit: Payer: Managed Care, Other (non HMO) | Admitting: Physician Assistant

## 2014-11-29 ENCOUNTER — Encounter: Payer: Self-pay | Admitting: Cardiovascular Disease

## 2014-11-30 ENCOUNTER — Other Ambulatory Visit: Payer: Self-pay | Admitting: Obstetrics & Gynecology

## 2014-11-30 DIAGNOSIS — R928 Other abnormal and inconclusive findings on diagnostic imaging of breast: Secondary | ICD-10-CM

## 2014-12-05 ENCOUNTER — Ambulatory Visit
Admission: RE | Admit: 2014-12-05 | Discharge: 2014-12-05 | Disposition: A | Discharge status: Home / Self Care | Payer: Managed Care, Other (non HMO) | Source: Ambulatory Visit | Attending: Obstetrics & Gynecology | Admitting: Obstetrics & Gynecology

## 2014-12-05 DIAGNOSIS — R928 Other abnormal and inconclusive findings on diagnostic imaging of breast: Secondary | ICD-10-CM

## 2015-05-28 ENCOUNTER — Encounter: Payer: Self-pay | Admitting: Nurse Practitioner

## 2015-05-28 ENCOUNTER — Other Ambulatory Visit: Payer: Self-pay | Admitting: Nurse Practitioner

## 2015-05-28 ENCOUNTER — Telehealth: Payer: Self-pay | Admitting: Cardiovascular Disease

## 2015-05-28 ENCOUNTER — Ambulatory Visit (INDEPENDENT_AMBULATORY_CARE_PROVIDER_SITE_OTHER): Payer: Managed Care, Other (non HMO) | Admitting: Nurse Practitioner

## 2015-05-28 VITALS — BP 156/94 | HR 65 | Ht 67.0 in | Wt 155.2 lb

## 2015-05-28 DIAGNOSIS — R030 Elevated blood-pressure reading, without diagnosis of hypertension: Secondary | ICD-10-CM

## 2015-05-28 DIAGNOSIS — I209 Angina pectoris, unspecified: Secondary | ICD-10-CM | POA: Diagnosis not present

## 2015-05-28 DIAGNOSIS — Z01811 Encounter for preprocedural respiratory examination: Secondary | ICD-10-CM

## 2015-05-28 DIAGNOSIS — E785 Hyperlipidemia, unspecified: Secondary | ICD-10-CM | POA: Diagnosis not present

## 2015-05-28 DIAGNOSIS — I259 Chronic ischemic heart disease, unspecified: Secondary | ICD-10-CM | POA: Diagnosis not present

## 2015-05-28 DIAGNOSIS — IMO0001 Reserved for inherently not codable concepts without codable children: Secondary | ICD-10-CM

## 2015-05-28 LAB — CBC
HCT: 44.9 % (ref 36.0–46.0)
Hemoglobin: 14.6 g/dL (ref 12.0–15.0)
MCH: 30.4 pg (ref 26.0–34.0)
MCHC: 32.5 g/dL (ref 30.0–36.0)
MCV: 93.3 fL (ref 78.0–100.0)
MPV: 10.6 fL (ref 8.6–12.4)
Platelets: 258 10*3/uL (ref 150–400)
RBC: 4.81 MIL/uL (ref 3.87–5.11)
RDW: 13.5 % (ref 11.5–15.5)
WBC: 8.7 10*3/uL (ref 4.0–10.5)

## 2015-05-28 LAB — BASIC METABOLIC PANEL
BUN: 18 mg/dL (ref 7–25)
CO2: 23 mmol/L (ref 20–31)
Calcium: 9.5 mg/dL (ref 8.6–10.2)
Chloride: 104 mmol/L (ref 98–110)
Creat: 0.66 mg/dL (ref 0.50–1.10)
Glucose, Bld: 84 mg/dL (ref 65–99)
Potassium: 4.1 mmol/L (ref 3.5–5.3)
Sodium: 139 mmol/L (ref 135–146)

## 2015-05-28 LAB — TROPONIN I: Troponin I: <0.03 ng/mL (ref <0.031)

## 2015-05-28 MED ORDER — NITROGLYCERIN 0.4 MG SL SUBL: SUBLINGUAL_TABLET | SUBLINGUAL | Status: DC

## 2015-05-28 MED ORDER — TICAGRELOR 90 MG PO TABS: 90.0000 mg | ORAL_TABLET | Freq: Two times a day (BID) | ORAL | Status: DC

## 2015-05-28 NOTE — Telephone Encounter (Signed)
New Message   Pt called c/o of left arm numbness earlier this am- pt stated now arm feels "heavy" Please call back and discuss.

## 2015-05-28 NOTE — Addendum Note (Signed)
Addended by: Eulis Foster on: 05/28/2015 12:04 PM   Modules accepted: Orders

## 2015-05-28 NOTE — Telephone Encounter (Signed)
I spoke with the pt and she did take 1 SL NTG.  She had slight improvement in her left arm heaviness but not complete resolution.  The pt will keep her appointment scheduled for today for further evaluation.

## 2015-05-28 NOTE — Patient Instructions (Addendum)
We will be checking the following labs today - STAT troponin  Also checking BMET, CBC, PT, PTT   Medication Instructions:    Continue with your current medicines. BUT   I am adding Brilinta 90 mg to take twice a day - start tonight - you have already had your first dose here in the office today.  I did refill your NTG today. This is at your drug store.     Testing/Procedures To Be Arranged:  Cardiac catheterization for tomorrow  Follow-Up:   See Dr. Burt Knack as planned in March    Other Special Instructions:  Your provider has recommended a cardiac catherization  You are scheduled for a cardiac catheterization on Tuesday, January 24th at 10:30 [facility] Dr. Irish Lack or associate.  Go to [facility] 2nd Floor Short Stay on Tuesday, January 24th at 8:30 AM Enter thru the [employer] entrance A No food or drink after midnight tonight. You may take your medications with a sip of water on the day of your procedure.   Coronary Angiogram A coronary angiogram, also called coronary angiography, is an X-ray procedure used to look at the arteries in the heart. In this procedure, a dye (contrast dye) is injected through a long, hollow tube (catheter). The catheter is about the size of a piece of cooked spaghetti and is inserted through your groin, wrist, or arm. The dye is injected into each artery, and X-rays are then taken to show if there is a blockage in the arteries of your heart.  LET [facility] CARE PROVIDER KNOW ABOUT: Any allergies you have, including allergies to shellfish or contrast dye.  All medicines you are taking, including vitamins, herbs, eye drops, creams, and over-the-counter medicines.  Previous problems you or members of your family have had with the use of anesthetics.  Any blood disorders you have.  Previous surgeries you have had. History of kidney problems or failure.  Other medical conditions you have.  RISKS AND COMPLICATIONS  Generally, a coronary  angiogram is a safe procedure. However, about 1 person out of 1000 can have problems that may include: Allergic reaction to the dye. Bleeding/bruising from the access site or other locations. Kidney injury, especially in people with impaired kidney function. Stroke (rare). Heart attack (rare). Irregular rhythms (rare) Death (rare)  BEFORE THE PROCEDURE  Do not eat or drink anything after midnight the night before the procedure or as directed by your health care provider.  Ask your health care provider about changing or stopping your regular medicines. This is especially important if you are taking diabetes medicines or blood thinners.  PROCEDURE You may be given a medicine to help you relax (sedative) before the procedure. This medicine is given through an intravenous (IV) access tube that is inserted into one of your veins.  The area where the catheter will be inserted will be washed and shaved. This is usually done in the groin but may be done in the fold of your arm (near your elbow) or in the wrist.  A medicine will be given to numb the area where the catheter will be inserted (local anesthetic).  The health care provider will insert the catheter into an artery. The catheter will be guided by using a special type of X-ray (fluoroscopy) of the blood vessel being examined.  A special dye will then be injected into the catheter, and X-rays will be taken. The dye will help to show where any narrowing or blockages are located in the heart arteries.  AFTER THE PROCEDURE  If the procedure is done through the leg, you will be kept in bed lying flat for several hours. You will be instructed to not bend or cross your legs. The insertion site will be checked frequently.  The pulse in your feet or wrist will be checked frequently.  Additional blood tests, X-rays, and an electrocardiogram may be done.     If you need a refill on your cardiac medications before your next appointment,  please call your pharmacy.   Call the [place] office at [phone] if you have any questions, problems or concerns.

## 2015-05-28 NOTE — Progress Notes (Signed)
CARDIOLOGY OFFICE NOTE  Date:  05/28/2015    Shelby Fitzpatrick Date of Birth: 01-13-1966 Medical Record [id number]  PCP:  Leeanne Rio, PA-C  Cardiologist:  Burt Knack    Chief Complaint  Patient presents with  . Coronary Artery Disease  . Chest Pain    Work in visit - seen for Dr. Burt Knack    History of Present Illness: Shelby Fitzpatrick is a 50 y.o. female who presents today for a work in visit. Seen for Dr. Burt Knack.   She is followed for coronary artery disease. 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 She was treated with a drug-eluting stent in the RCA. She had minor nonobstructive disease in the left coronary distribution. Her left ventricular function has been normal. Other issues include HLD and ongoing tobacco abuse. Normal stress echo from 08/2010 noted.   Seen almost one year ago - was continuing to smoke. BP borderline and he increased her Lopressor. Had some atypical chest pain noted at that visit with EKG being ok. Lots of stress.   Phone call today -  I spoke with the pt and she did take 1 SL NTG. She had slight improvement in her left arm heaviness but not complete resolution. The pt will keep her appointment scheduled for today for further evaluation.           Barkley Boards, RN at 05/28/2015 8:34 AM     Status: Signed       Expand All Collapse All   I spoke with the pt and she complains of 3-4 sharp pains in the center of her chest over the weekend. She said they were quick and bad enough to catch her attention. The pt has also noticed that she has had to take more TUMS over the past week. Today the pt woke up with left arm tingling and heaviness in her arm. The pt denies CP and SOB at this time and she has not used SL NTG.   Discussed pt with Truitt Merle NP and she would like the pt to try a SL NTG now to see if this helps her left arm heaviness. The pt will be scheduled for an office visit today at 11:00. I made the pt aware  of this information and will contact her again in a few moments to see if her NTG helps her symptoms       Thus added to the FLEX.  Comes in today. Here alone. She notes that she has done well since her last visit here up until this past weekend. She had 3 to 4 spells of midcenter chest pain - very fleeting but enough "to get my attention". No aggravating or relieving factors. No other associated symptoms. This morning, woke about 5:45 and noted her left arm felt heavy and tingling. She called here - she did end up taking a NTG with very prompt relief. She continues to smoke. She has had trouble adjusting to her only child going off to college. She does not exercise routinely. She says she is fine now.   Past Medical History  Diagnosis Date  . CAD (coronary artery disease)     coronary intervention and drug-eluting stent placement STEMI-2011   Cath 02/25/2010:  100% RCA (PCI with Promus DES); minor LAD and CFX (nothing more than 20%)  EF 65% at cath 02/25/2010  . Hyperlipemia   . Ectopic pregnancy     (ruptured)    Past Surgical History  Procedure  Laterality Date  . Coronary intervention       drug-eluting stent placement-2011     Medications: Current Outpatient Prescriptions  Medication Sig Dispense Refill  . aspirin 81 MG tablet Take 1 tablet (81 mg total) by mouth daily.    [name] Kitchen aspirin-acetaminophen-caffeine (EXCEDRIN MIGRAINE) 250-250-65 MG per tablet Take 2 tablets by mouth every 6 (six) hours as needed.    [name] Kitchen atorvastatin (LIPITOR) 20 MG tablet Take 1 tablet (20 mg total) by mouth daily. 30 tablet 11  . cyclobenzaprine (FLEXERIL) 10 MG tablet TK 1 T PO QHS PRF MUSCLE SPASMSn in lower back  0  . meclizine (ANTIVERT) 25 MG tablet Take 1 tablet (25 mg total) by mouth 3 (three) times daily as needed for dizziness. 30 tablet 0  . metoprolol tartrate (LOPRESSOR) 25 MG tablet Take 1 tablet (25 mg total) by mouth 2 (two) times daily. 60 tablet 11  . nitroGLYCERIN (NITROSTAT) 0.4 MG SL  tablet PLACE 1 TABLET UNDER THE TONGUE EVERY 5 MINUTES 3 TIMES AS NEEDED AS DIRECTED 25 tablet 4  . ticagrelor (BRILINTA) 90 MG TABS tablet Take 1 tablet (90 mg total) by mouth 2 (two) times daily. 60 tablet 6   No current facility-administered medications for this visit.    Allergies: Allergies  Allergen Reactions  . Penicillins     Social History: The patient  reports that she has been smoking Cigarettes.  She has never used smokeless tobacco. She reports that she drinks alcohol. She reports that she does not use illicit drugs.   Family History: The patient's family history includes Coronary artery disease in her father; Heart attack in her father.   Review of Systems: Please see the history of present illness.   Otherwise, the review of systems is positive for none.   All other systems are reviewed and negative.   Physical Exam: VS:  BP 156/94 mmHg  Pulse 65  Ht 5\' 7"  (1.702 m)  Wt 155 lb 3.2 oz (70.398 kg)  BMI 24.30 kg/m2 .  BMI Body mass index is 24.3 kg/(m^2).  Wt Readings from Last 3 Encounters:  05/28/15 155 lb 3.2 oz (70.398 kg)  08/29/14 152 lb (68.947 kg)  06/27/14 157 lb (71.215 kg)   BP by me is 140/100  General: Pleasant. Well developed, well nourished and in no acute distress.  HEENT: Normal. Neck: Supple, no JVD, carotid bruits, or masses noted.  Cardiac: Regular rate and rhythm. No murmurs, rubs, or gallops. No edema.  Respiratory:  Lungs are clear to auscultation bilaterally with normal work of breathing.  GI: Soft and nontender.  MS: No deformity or atrophy. Gait and ROM intact. Skin: Warm and dry. Color is normal.  Neuro:  Strength and sensation are intact and no gross focal deficits noted.  Psych: Alert, appropriate and with normal affect.   LABORATORY DATA:  EKG:  EKG is ordered today. This demonstrates NSR - no acute changes. Reviewed with Dr. Johnsie Cancel (DOD)  Lab Results  Component Value Date   WBC 7.6 02/27/2010   HGB 12.4 02/27/2010   HCT  37.9 02/27/2010   PLT 237 02/27/2010   GLUCOSE 90 05/11/2013   CHOL 124 06/28/2014   TRIG 100.0 06/28/2014   HDL 41.90 06/28/2014   LDLCALC 62 06/28/2014   ALT 16 06/28/2014   AST 20 06/28/2014   NA 137 05/11/2013   K 4.3 05/11/2013   CL 103 05/11/2013   CREATININE 0.8 05/11/2013   BUN 18 05/11/2013   CO2 26 05/11/2013  INR 0.93 02/25/2010    BNP (last 3 results) No results for input(s): BNP in the last 8760 hours.  ProBNP (last 3 results) No results for input(s): PROBNP in the last 8760 hours.   Other Studies Reviewed Today:   Assessment/Plan: 1. Chest pain/unstable angina - reviewed with Dr. Johnsie Cancel who has seen her as well - felt to best proceed with repeat cardiac catheterization - scheduled for tomorrow - The patient understands that risks include but are not limited to stroke (1 in 1000), death (1 in 1000), kidney failure [usually temporary] (1 in 500), bleeding (1 in 200), allergic reaction [possibly serious] (1 in 200), and agrees to proceed. Lab today will include a troponin - she understands that she will need to proceed on to the hospital if this is +.   2. HTN - may be driven some by anxiety - will need to continue to monitor. May need additional therapy.   3. HLD - on statin therapy  4. Tobacco abuse - she is not ready to stop but this was discussed today.   Current medicines are reviewed with the patient today.  The patient does not have concerns regarding medicines other than what has been noted above.  The following changes have been made:  See above.  Labs/ tests ordered today include:    Orders Placed This Encounter  Procedures  . Basic metabolic panel  . CBC  . APTT  . Protime-INR  . EKG 12-Lead     Disposition:   Further disposition to follow. Tentatively keep March OV with Dr. Burt Knack. I will be happy to see back as needed.   Patient is agreeable to this plan and will call if any problems develop in the interim.   Signed: Burtis Junes, RN, ANP-C 05/28/2015 11:47 AM  [place] [address] [place], [place]  [address] Phone: [phone] Fax: [phone]

## 2015-05-28 NOTE — Addendum Note (Signed)
Addended by: Eulis Foster on: 05/28/2015 01:03 PM   Modules accepted: Orders

## 2015-05-28 NOTE — Telephone Encounter (Signed)
I spoke with the pt and she complains of 3-4 sharp pains in the center of her chest over the weekend.  She said they were quick and bad enough to catch her attention.  The pt has also noticed that she has had to take more TUMS over the past week.  Today the pt woke up with left arm tingling and heaviness in her arm.  The pt denies CP and SOB at this time and she has not used SL NTG.    Discussed pt with Truitt Merle NP and she would like the pt to try a SL NTG now to see if this helps her left arm heaviness.  The pt will be scheduled for an office visit today at 11:00.  I made the pt aware of this information and will contact her again in a few moments to see if her NTG helps her symptoms.

## 2015-05-29 ENCOUNTER — Encounter (HOSPITAL_COMMUNITY)
Admission: RE | Disposition: A | Discharge status: Home / Self Care | Payer: Self-pay | Source: Ambulatory Visit | Attending: Interventional Cardiology

## 2015-05-29 ENCOUNTER — Ambulatory Visit (HOSPITAL_COMMUNITY)
Admission: RE | Admit: 2015-05-29 | Discharge: 2015-05-29 | Disposition: A | Discharge status: Home / Self Care | Payer: Managed Care, Other (non HMO) | Source: Ambulatory Visit | Attending: Interventional Cardiology | Admitting: Interventional Cardiology

## 2015-05-29 DIAGNOSIS — F17210 Nicotine dependence, cigarettes, uncomplicated: Secondary | ICD-10-CM | POA: Diagnosis not present

## 2015-05-29 DIAGNOSIS — Z7982 Long term (current) use of aspirin: Secondary | ICD-10-CM | POA: Insufficient documentation

## 2015-05-29 DIAGNOSIS — R0789 Other chest pain: Secondary | ICD-10-CM | POA: Insufficient documentation

## 2015-05-29 DIAGNOSIS — Q245 Malformation of coronary vessels: Secondary | ICD-10-CM | POA: Diagnosis not present

## 2015-05-29 DIAGNOSIS — Z955 Presence of coronary angioplasty implant and graft: Secondary | ICD-10-CM | POA: Diagnosis not present

## 2015-05-29 DIAGNOSIS — I252 Old myocardial infarction: Secondary | ICD-10-CM | POA: Diagnosis not present

## 2015-05-29 DIAGNOSIS — I209 Angina pectoris, unspecified: Secondary | ICD-10-CM | POA: Insufficient documentation

## 2015-05-29 DIAGNOSIS — E785 Hyperlipidemia, unspecified: Secondary | ICD-10-CM | POA: Insufficient documentation

## 2015-05-29 DIAGNOSIS — Z8249 Family history of ischemic heart disease and other diseases of the circulatory system: Secondary | ICD-10-CM | POA: Diagnosis not present

## 2015-05-29 DIAGNOSIS — I25110 Atherosclerotic heart disease of native coronary artery with unstable angina pectoris: Secondary | ICD-10-CM | POA: Insufficient documentation

## 2015-05-29 DIAGNOSIS — I2510 Atherosclerotic heart disease of native coronary artery without angina pectoris: Secondary | ICD-10-CM | POA: Diagnosis present

## 2015-05-29 DIAGNOSIS — Z880 Allergy status to penicillin: Secondary | ICD-10-CM | POA: Diagnosis not present

## 2015-05-29 DIAGNOSIS — I10 Essential (primary) hypertension: Secondary | ICD-10-CM | POA: Insufficient documentation

## 2015-05-29 HISTORY — PX: CARDIAC CATHETERIZATION: SHX172

## 2015-05-29 LAB — PROTIME-INR
INR: 0.92 (ref <1.50)
Prothrombin Time: 12.5 seconds (ref 11.6–15.2)

## 2015-05-29 LAB — APTT: aPTT: 34 seconds (ref 24–37)

## 2015-05-29 SURGERY — LEFT HEART CATH AND CORONARY ANGIOGRAPHY
Anesthesia: LOCAL

## 2015-05-29 MED ORDER — HEPARIN SODIUM (PORCINE) 1000 UNIT/ML IJ SOLN
INTRAMUSCULAR | Status: DC | PRN
  Administered 2015-05-29: 4000 [IU] via INTRAVENOUS

## 2015-05-29 MED ORDER — FENTANYL CITRATE (PF) 100 MCG/2ML IJ SOLN
INTRAMUSCULAR | Status: AC
Start: 2015-05-29 — End: 2015-05-29
  Filled 2015-05-29: qty 2

## 2015-05-29 MED ORDER — ASPIRIN 81 MG PO CHEW: 81.0000 mg | CHEWABLE_TABLET | ORAL | Status: DC

## 2015-05-29 MED ORDER — FENTANYL CITRATE (PF) 100 MCG/2ML IJ SOLN
INTRAMUSCULAR | Status: DC | PRN
  Administered 2015-05-29: 50 ug via INTRAVENOUS
  Administered 2015-05-29: 25 ug via INTRAVENOUS

## 2015-05-29 MED ORDER — MIDAZOLAM HCL 2 MG/2ML IJ SOLN
INTRAMUSCULAR | Status: AC
  Filled 2015-05-29: qty 2

## 2015-05-29 MED ORDER — SODIUM CHLORIDE 0.9 % WEIGHT BASED INFUSION: 1.0000 mL/kg/h | INTRAVENOUS | Status: DC

## 2015-05-29 MED ORDER — SODIUM CHLORIDE 0.9 % IV SOLN
250.0000 mL | INTRAVENOUS | Status: DC | PRN
Start: 2015-05-29 — End: 2015-05-29

## 2015-05-29 MED ORDER — SODIUM CHLORIDE 0.9 % IV SOLN: 250.0000 mL | INTRAVENOUS | Status: DC | PRN

## 2015-05-29 MED ORDER — VERAPAMIL HCL 2.5 MG/ML IV SOLN
INTRAVENOUS | Status: DC | PRN
  Administered 2015-05-29: 13:00:00 via INTRA_ARTERIAL

## 2015-05-29 MED ORDER — IOHEXOL 350 MG/ML SOLN
INTRAVENOUS | Status: DC | PRN
  Administered 2015-05-29: 70 mL via INTRACARDIAC

## 2015-05-29 MED ORDER — SODIUM CHLORIDE 0.9 % IJ SOLN: 3.0000 mL | INTRAMUSCULAR | Status: DC | PRN

## 2015-05-29 MED ORDER — HEPARIN (PORCINE) IN NACL 2-0.9 UNIT/ML-% IJ SOLN
INTRAMUSCULAR | Status: DC | PRN
  Administered 2015-05-29: 14:00:00

## 2015-05-29 MED ORDER — MIDAZOLAM HCL 2 MG/2ML IJ SOLN
INTRAMUSCULAR | Status: DC | PRN
  Administered 2015-05-29: 1 mg via INTRAVENOUS
  Administered 2015-05-29: 2 mg via INTRAVENOUS

## 2015-05-29 MED ORDER — SODIUM CHLORIDE 0.9% FLUSH: 3.0000 mL | Freq: Two times a day (BID) | INTRAVENOUS | Status: DC

## 2015-05-29 MED ORDER — HEPARIN SODIUM (PORCINE) 1000 UNIT/ML IJ SOLN
INTRAMUSCULAR | Status: AC
  Filled 2015-05-29: qty 1

## 2015-05-29 MED ORDER — VERAPAMIL HCL 2.5 MG/ML IV SOLN
INTRAVENOUS | Status: AC
  Filled 2015-05-29: qty 2

## 2015-05-29 MED ORDER — SODIUM CHLORIDE 0.9% FLUSH
3.0000 mL | INTRAVENOUS | Status: DC | PRN
Start: 2015-05-29 — End: 2015-05-29

## 2015-05-29 MED ORDER — SODIUM CHLORIDE 0.9 % IJ SOLN: 3.0000 mL | Freq: Two times a day (BID) | INTRAMUSCULAR | Status: DC

## 2015-05-29 MED ORDER — SODIUM CHLORIDE 0.9 % IV SOLN
INTRAVENOUS | Status: DC
  Administered 2015-05-29: 09:00:00 via INTRAVENOUS

## 2015-05-29 SURGICAL SUPPLY — 11 items

## 2015-05-29 NOTE — H&P (View-Only) (Signed)
CARDIOLOGY OFFICE NOTE  Date:  05/28/2015    Shelby Fitzpatrick Date of Birth: 1966-03-03 Medical Record [id number]  PCP:  Shelby Rio, PA-C  Cardiologist:  Shelby Fitzpatrick    Chief Complaint  Patient presents with  . Coronary Artery Disease  . Chest Pain    Work in visit - seen for Dr. Burt Fitzpatrick    History of Present Illness: Shelby Fitzpatrick is a 50 y.o. female who presents today for a work in visit. Seen for Dr. Burt Fitzpatrick.   She is followed for coronary artery disease. 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 She was treated with a drug-eluting stent in Shelby RCA. She had minor nonobstructive disease in Shelby left coronary distribution. Shelby Fitzpatrick left ventricular function has been normal. Other issues include HLD and ongoing tobacco abuse. Normal stress echo from 08/2010 noted.   Seen almost one year ago - was continuing to smoke. BP borderline and he increased Shelby Fitzpatrick Lopressor. Had some atypical chest pain noted at that visit with EKG being ok. Lots of stress.   Phone call today -  I spoke with Shelby Fitzpatrick and she did take 1 SL NTG. She had slight improvement in Shelby Fitzpatrick left arm heaviness but not complete resolution. Shelby Fitzpatrick will keep Shelby Fitzpatrick appointment scheduled for today for further evaluation.           Barkley Boards, RN at 05/28/2015 8:34 AM     Status: Signed       Expand All Collapse All   I spoke with Shelby Fitzpatrick and she complains of 3-4 sharp pains in Shelby center of Shelby Fitzpatrick chest over Shelby weekend. She said they were quick and bad enough to catch Shelby Fitzpatrick attention. Shelby Fitzpatrick has also noticed that she has had to take more TUMS over Shelby past week. Today Shelby Fitzpatrick woke up with left arm tingling and heaviness in Shelby Fitzpatrick arm. Shelby Fitzpatrick denies CP and SOB at this time and she has not used SL NTG.   Discussed Fitzpatrick with Shelby Merle NP and she would like Shelby Fitzpatrick to try a SL NTG now to see if this helps Shelby Fitzpatrick left arm heaviness. Shelby Fitzpatrick will be scheduled for an office visit today at 11:00. I made Shelby Fitzpatrick aware  of this information and will contact Shelby Fitzpatrick again in a few moments to see if Shelby Fitzpatrick NTG helps Shelby Fitzpatrick symptoms       Thus added to Shelby FLEX.  Comes in today. Here alone. She notes that she has done well since Shelby Fitzpatrick last visit here up until this past weekend. She had 3 to 4 spells of midcenter chest pain - very fleeting but enough "to get my attention". No aggravating or relieving factors. No other associated symptoms. This morning, woke about 5:45 and noted Shelby Fitzpatrick left arm felt heavy and tingling. She called here - she did end up taking a NTG with very prompt relief. She continues to smoke. She has had trouble adjusting to Shelby Fitzpatrick only child going off to college. She does not exercise routinely. She says she is fine now.   Past Medical History  Diagnosis Date  . CAD (coronary artery disease)     coronary intervention and drug-eluting stent placement STEMI-2011   Cath 02/25/2010:  100% RCA (PCI with Promus DES); minor LAD and CFX (nothing more than 20%)  EF 65% at cath 02/25/2010  . Hyperlipemia   . Ectopic pregnancy     (ruptured)    Past Surgical History  Procedure  Laterality Date  . Coronary intervention       drug-eluting stent placement-2011     Medications: Current Outpatient Prescriptions  Medication Sig Dispense Refill  . aspirin 81 MG tablet Take 1 tablet (81 mg total) by mouth daily.    [name] Kitchen aspirin-acetaminophen-caffeine (EXCEDRIN MIGRAINE) 250-250-65 MG per tablet Take 2 tablets by mouth every 6 (six) hours as needed.    [name] Kitchen atorvastatin (LIPITOR) 20 MG tablet Take 1 tablet (20 mg total) by mouth daily. 30 tablet 11  . cyclobenzaprine (FLEXERIL) 10 MG tablet TK 1 T PO QHS PRF MUSCLE SPASMSn in lower back  0  . meclizine (ANTIVERT) 25 MG tablet Take 1 tablet (25 mg total) by mouth 3 (three) times daily as needed for dizziness. 30 tablet 0  . metoprolol tartrate (LOPRESSOR) 25 MG tablet Take 1 tablet (25 mg total) by mouth 2 (two) times daily. 60 tablet 11  . nitroGLYCERIN (NITROSTAT) 0.4 MG SL  tablet PLACE 1 TABLET UNDER Shelby TONGUE EVERY 5 MINUTES 3 TIMES AS NEEDED AS DIRECTED 25 tablet 4  . ticagrelor (BRILINTA) 90 MG TABS tablet Take 1 tablet (90 mg total) by mouth 2 (two) times daily. 60 tablet 6   No current facility-administered medications for this visit.    Allergies: Allergies  Allergen Reactions  . Penicillins     Social History: [place] patient  reports that she has been smoking Cigarettes.  She has never used smokeless tobacco. She reports that she drinks alcohol. She reports that she does not use illicit drugs.   Family History: Shelby patient's family history includes Coronary artery disease in [place] Fitzpatrick father; Heart attack in [place] Fitzpatrick father.   Review of Systems: Please see Shelby history of present illness.   Otherwise, Shelby review of systems is positive for none.   All other systems are reviewed and negative.   Physical Exam: VS:  BP 156/94 mmHg  Pulse 65  Ht 5\' 7"  (1.702 m)  Wt 155 lb 3.2 oz (70.398 kg)  BMI 24.30 kg/m2 .  BMI Body mass index is 24.3 kg/(m^2).  Wt Readings from Last 3 Encounters:  05/28/15 155 lb 3.2 oz (70.398 kg)  08/29/14 152 lb (68.947 kg)  06/27/14 157 lb (71.215 kg)   BP by me is 140/100  General: Pleasant. Well developed, well nourished and in no acute distress.  HEENT: Normal. Neck: Supple, no JVD, carotid bruits, or masses noted.  Cardiac: Regular rate and rhythm. No murmurs, rubs, or gallops. No edema.  Respiratory:  Lungs are clear to auscultation bilaterally with normal work of breathing.  GI: Soft and nontender.  MS: No deformity or atrophy. Gait and ROM intact. Skin: Warm and dry. Color is normal.  Neuro:  Strength and sensation are intact and no gross focal deficits noted.  Psych: Alert, appropriate and with normal affect.   LABORATORY DATA:  EKG:  EKG is ordered today. This demonstrates NSR - no acute changes. Reviewed with Dr. Johnsie Cancel (DOD)  Lab Results  Component Value Date   WBC 7.6 02/27/2010   HGB 12.4 02/27/2010   HCT  37.9 02/27/2010   PLT 237 02/27/2010   GLUCOSE 90 05/11/2013   CHOL 124 06/28/2014   TRIG 100.0 06/28/2014   HDL 41.90 06/28/2014   LDLCALC 62 06/28/2014   ALT 16 06/28/2014   AST 20 06/28/2014   NA 137 05/11/2013   K 4.3 05/11/2013   CL 103 05/11/2013   CREATININE 0.8 05/11/2013   BUN 18 05/11/2013   CO2 26 05/11/2013  INR 0.93 02/25/2010    BNP (last 3 results) No results for input(s): BNP in [place] last 8760 hours.  ProBNP (last 3 results) No results for input(s): PROBNP in [place] last 8760 hours.   Other Studies Reviewed Today:   Assessment/Plan: 1. Chest pain/unstable angina - reviewed with Dr. Johnsie Cancel who has seen Shelby Fitzpatrick as well - felt to best proceed with repeat cardiac catheterization - scheduled for tomorrow - [place] patient understands that risks include but are not limited to stroke (1 in 1000), death (1 in 1000), kidney failure [usually temporary] (1 in 500), bleeding (1 in 200), allergic reaction [possibly serious] (1 in 200), and agrees to proceed. Lab today will include a troponin - she understands that she will need to proceed on to [place] hospital if this is +.   2. HTN - may be driven some by anxiety - will need to continue to monitor. May need additional therapy.   3. HLD - on statin therapy  4. Tobacco abuse - she is not ready to stop but this was discussed today.   Current medicines are reviewed with Shelby patient today.  Shel[place] patient does not have concerns regarding medicines other than what has been noted above.  Shelby following changes have been made:  See above.  Labs/ tests ordered today include:    Orders Placed This Encounter  Procedures  . Basic metabolic panel  . CBC  . APTT  . Protime-INR  . EKG 12-Lead     Disposition:   Further disposition to follow. Tentatively keep March OV with Dr. Burt Fitzpatrick. I will be happy to see back as needed.   Patient is agreeable to this plan and will call if any problems develop in Shelby interim.   Signed: Burtis Junes, RN, ANP-C 05/28/2015 11:47 AM  [facility] [address] [place], [place]  [address] Phone: [phone] Fax: [phone]

## 2015-05-29 NOTE — Interval H&P Note (Signed)
Cath Lab Visit (complete for each Cath Lab visit)  Clinical Evaluation Leading to the Procedure:   ACS: No.  Non-ACS:    Anginal Classification: CCS III  Anti-ischemic medical therapy: Minimal Therapy (1 class of medications)  Non-Invasive Test Results: No non-invasive testing performed  Prior CABG: No previous CABG      History and Physical Interval Note:  05/29/2015 12:57 PM  Shelby Fitzpatrick  has presented today for surgery, with the diagnosis of unstable angina  The various methods of treatment have been discussed with the patient and family. After consideration of risks, benefits and other options for treatment, the patient has consented to  Procedure(s): Left Heart Cath and Coronary Angiography (N/A) as a surgical intervention .  The patient's history has been reviewed, patient examined, no change in status, stable for surgery.  I have reviewed the patient's chart and labs.  Questions were answered to the patient's satisfaction.     Connelly [place]ter[place] S.

## 2015-05-29 NOTE — Discharge Instructions (Signed)
Radial Site Care °Refer to this sheet in the next few weeks. These instructions provide you with information about caring for yourself after your procedure. Your health care provider may also give you more specific instructions. Your treatment has been planned according to current medical practices, but problems sometimes occur. Call your health care provider if you have any problems or questions after your procedure. °WHAT TO EXPECT AFTER THE PROCEDURE °After your procedure, it is typical to have the following: °· Bruising at the radial site that usually fades within 1-2 weeks. °· Blood collecting in the tissue (hematoma) that may be painful to the touch. It should usually decrease in size and tenderness within 1-2 weeks. °HOME CARE INSTRUCTIONS °· Take medicines only as directed by your health care provider. °· You may shower 24-48 hours after the procedure or as directed by your health care provider. Remove the bandage (dressing) and gently wash the site with plain soap and water. Pat the area dry with a clean towel. Do not rub the site, because this may cause bleeding. °· Do not take baths, swim, or use a hot tub until your health care provider approves. °· Check your insertion site every day for redness, swelling, or drainage. °· Do not apply powder or lotion to the site. °· Do not flex or bend the affected arm for 24 hours or as directed by your health care provider. °· Do not push or pull heavy objects with the affected arm for 24 hours or as directed by your health care provider. °· Do not lift over 10 lb (4.5 kg) for 5 days after your procedure or as directed by your health care provider. °· Ask your health care provider when it is okay to: °¨ Return to work or school. °¨ Resume usual physical activities or sports. °¨ Resume sexual activity. °· Do not drive home if you are discharged the same day as the procedure. Have someone else drive you. °· You may drive 24 hours after the procedure unless otherwise  instructed by your health care provider. °· Do not operate machinery or power tools for 24 hours after the procedure. °· If your procedure was done as an outpatient procedure, which means that you went home the same day as your procedure, a responsible adult should be with you for the first 24 hours after you arrive home. °· Keep all follow-up visits as directed by your health care provider. This is important. °SEEK MEDICAL CARE IF: °· You have a fever. °· You have chills. °· You have increased bleeding from the radial site. Hold pressure on the site. CALL 911 °SEEK IMMEDIATE MEDICAL CARE IF: °· You have unusual pain at the radial site. °· You have redness, warmth, or swelling at the radial site. °· You have drainage (other than a small amount of blood on the dressing) from the radial site. °· The radial site is bleeding, and the bleeding does not stop after 30 minutes of holding steady pressure on the site. °· Your arm or hand becomes pale, cool, tingly, or numb. °  °This information is not intended to replace advice given to you by your health care provider. Make sure you discuss any questions you have with your health care provider. °  °Document Released: 05/24/2010 Document Revised: 05/12/2014 Document Reviewed: 11/07/2013 °Elsevier Interactive Patient Education ©2016 [employer] ° °

## 2015-05-30 ENCOUNTER — Encounter (HOSPITAL_COMMUNITY): Payer: Self-pay | Admitting: Interventional Cardiology

## 2015-06-05 ENCOUNTER — Telehealth: Payer: Self-pay | Admitting: Cardiovascular Disease

## 2015-06-05 NOTE — Telephone Encounter (Signed)
New Message  Pt states that she recently had a CATH. The note to states not to return until she received authorization from cardiolosigt. Pt will return to work on 06/06/2015. Pt will need a new a note. Pt will come to pick it up.

## 2015-06-05 NOTE — Telephone Encounter (Signed)
Pt came into the office and return to work note given.

## 2015-06-15 ENCOUNTER — Other Ambulatory Visit: Payer: Self-pay | Admitting: Cardiovascular Disease

## 2015-07-11 ENCOUNTER — Other Ambulatory Visit: Payer: Self-pay | Admitting: Cardiovascular Disease

## 2015-07-20 ENCOUNTER — Encounter: Payer: Self-pay | Admitting: Cardiovascular Disease

## 2015-07-20 ENCOUNTER — Ambulatory Visit (INDEPENDENT_AMBULATORY_CARE_PROVIDER_SITE_OTHER): Payer: Managed Care, Other (non HMO) | Admitting: Cardiovascular Disease

## 2015-07-20 VITALS — BP 132/82 | HR 64 | Ht 67.0 in | Wt 157.0 lb

## 2015-07-20 DIAGNOSIS — E785 Hyperlipidemia, unspecified: Secondary | ICD-10-CM | POA: Diagnosis not present

## 2015-07-20 LAB — HEPATIC FUNCTION PANEL
ALBUMIN: 4.2 g/dL (ref 3.6–5.1)
ALK PHOS: 62 U/L (ref 33–115)
ALT: 12 U/L (ref 6–29)
AST: 17 U/L (ref 10–35)
Bilirubin, Direct: <0.1 mg/dL (ref <=0.2)
TOTAL PROTEIN: 6.7 g/dL (ref 6.1–8.1)
Total Bilirubin: 0.3 mg/dL (ref 0.2–1.2)

## 2015-07-20 LAB — LIPID PANEL
Cholesterol: 141 mg/dL (ref 125–200)
HDL: 56 mg/dL (ref >=46)
LDL CALC: 61 mg/dL (ref <130)
TRIGLYCERIDES: 121 mg/dL (ref <150)
Total CHOL/HDL Ratio: 2.5 Ratio (ref <=5.0)
VLDL: 24 mg/dL (ref <30)

## 2015-07-20 NOTE — Patient Instructions (Signed)
Medication Instructions:  Your physician recommends that you continue on your current medications as directed. Please refer to the Current Medication list given to you today.  Labwork: Your physician recommends that you have lab work today: LIPID and LIVER  Testing/Procedures: No new orders.   Follow-Up: Your physician wants you to follow-up in: 1 YEAR with Dr Cooper.  You will receive a reminder letter in the mail two months in advance. If you don't receive a letter, please call our office to schedule the follow-up appointment.   Any Other Special Instructions Will Be Listed Below (If Applicable).     If you need a refill on your cardiac medications before your next appointment, please call your pharmacy.   

## 2015-07-20 NOTE — Progress Notes (Signed)
Cardiology Office Note   Date:  07/21/2015   ID:  Shelby Fitzpatrick, DOB [birth year], MRN [id number]  PCP:  Leeanne Rio, PA-C  Cardiologist:  Sherren Mocha, MD    Chief Complaint  Patient presents with  . Follow-up    CAD    History of Present Illness: Shelby Fitzpatrick is a 50 y.o. female who presents for follow-up evaluation.  The patient is followed for coronary artery disease. 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 She was treated with a drug-eluting stent in the RCA. She had minor nonobstructive disease in the left coronary distribution. Her left ventricular function has been normal. She is followed a good program of risk reduction.    The patient was hospitalized in January with chest pain and underwent cardiac cath showing continued patency of her stent site and no significant obstruction elsewhere. She is now doing well with no recurrence of pain. No dyspnea, edema, or other complaints. Still smoking. Walking for exercise with her dog on a regular basis.  Past Medical History  Diagnosis Date  . CAD (coronary artery disease)     coronary intervention and drug-eluting stent placement STEMI-2011   Cath 02/25/2010:  100% RCA (PCI with Promus DES); minor LAD and CFX (nothing more than 20%)  EF 65% at cath 02/25/2010  . Hyperlipemia   . Ectopic pregnancy     (ruptured)    Past Surgical History  Procedure Laterality Date  . Coronary intervention       drug-eluting stent placement-2011  . Cardiac catheterization N/A 05/29/2015    Procedure: Left Heart Cath and Coronary Angiography;  Surgeon: Jettie Booze, MD;  Location: [facility];  Service: Cardiovascular;  Laterality: N/A;    Current Outpatient Prescriptions  Medication Sig Dispense Refill  . aspirin 81 MG tablet Take 1 tablet (81 mg total) by mouth daily.    [name] Kitchen aspirin-acetaminophen-caffeine (EXCEDRIN MIGRAINE) 250-250-65 MG per tablet Take 2 tablets by mouth every 6 (six) hours as  needed.    [name] Kitchen atorvastatin (LIPITOR) 20 MG tablet TAKE 1 TABLET BY MOUTH DAILY 30 tablet 1  . cyclobenzaprine (FLEXERIL) 10 MG tablet TK 1 T PO QHS PRF MUSCLE SPASMSn in lower back  0  . meclizine (ANTIVERT) 25 MG tablet Take 1 tablet (25 mg total) by mouth 3 (three) times daily as needed for dizziness. 30 tablet 0  . metoprolol tartrate (LOPRESSOR) 25 MG tablet TAKE 1 TABLET BY MOUTH TWICE DAILY 60 tablet 11  . nitroGLYCERIN (NITROSTAT) 0.4 MG SL tablet PLACE 1 TABLET UNDER THE TONGUE EVERY 5 MINUTES 3 TIMES AS NEEDED AS DIRECTED 25 tablet 4   No current facility-administered medications for this visit.    Allergies:   Penicillins   Social History:  The patient  reports that she has been smoking Cigarettes.  She has never used smokeless tobacco. She reports that she drinks alcohol. She reports that she does not use illicit drugs.   Family History:  The patient's family history includes Coronary artery disease in her father; Heart attack in her father.   ROS:  Please see the history of present illness.  Otherwise, review of systems is positive for dizziness.  All other systems are reviewed and negative.   PHYSICAL EXAM: VS:  BP 132/82 mmHg  Pulse 64  Ht 5\' 7"  (1.702 m)  Wt 157 lb (71.215 kg)  BMI 24.58 kg/m2 , BMI Body mass index is 24.58 kg/(m^2). GEN: Well nourished, well developed, in no acute  distress HEENT: normal Neck: no JVD, no masses, no carotid bruits Cardiac: RRR without murmur or gallop                Respiratory:  clear to auscultation bilaterally, normal work of breathing GI: soft, nontender, nondistended, + BS MS: no deformity or atrophy Ext: no pretibial edema Skin: warm and dry, no rash Neuro:  Strength and sensation are intact Psych: euthymic mood, full affect  EKG:  EKG is not ordered today.  Recent Labs: 05/28/2015: BUN 18; Creat 0.66; Hemoglobin 14.6; Platelets 258; Potassium 4.1; Sodium 139 07/20/2015: ALT 12   Lipid Panel     Component Value Date/Time     CHOL 141 07/20/2015 0814   TRIG 121 07/20/2015 0814   HDL 56 07/20/2015 0814   CHOLHDL 2.5 07/20/2015 0814   VLDL 24 07/20/2015 0814   LDLCALC 61 07/20/2015 0814     Wt Readings from Last 3 Encounters:  07/20/15 157 lb (71.215 kg)  05/29/15 155 lb (70.308 kg)  05/28/15 155 lb 3.2 oz (70.398 kg)    Cardiac Cath 05/29/2015: Conclusion     Patent stent in the RCA.  The left ventricular systolic function is normal.  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  Will stop Brilinta which was started in the office yesterday in anticipation of need for PCI. Continue aggressive secondary prevention.     ASSESSMENT AND PLAN: 1.  Coronary artery disease, native vessel. Recent cath reviewed with patent stent. Meds reviewed and will be continued.   2. Hyperlipidemia: Last year's lipids reviewed. Patient is on a statin drug. Pt fasting today - will draw lipids and LFT's.   3. Essential hypertension, controlled. Metoprolol increased last year. Continue same Rx  4. Tobacco: cessation advised.  Current medicines are reviewed with the patient today.  The patient does not have concerns regarding medicines.  The following changes have been made:   Increase metoprolol to 25 mg twice daily  Labs/ tests ordered today include:   Orders Placed This Encounter  Procedures  . Lipid panel  . Hepatic function panel   Disposition:   FU one year  Signed, Sherren Mocha, MD  07/21/2015 12:06 AM    [facility] [place], [place], [place]  [address] Phone: [phone]; Fax: [phone]

## 2015-09-06 ENCOUNTER — Other Ambulatory Visit: Payer: Self-pay | Admitting: Cardiovascular Disease

## 2015-12-04 ENCOUNTER — Other Ambulatory Visit: Payer: Self-pay | Admitting: Obstetrics & Gynecology

## 2015-12-06 LAB — CYTOLOGY - PAP

## 2015-12-31 NOTE — Progress Notes (Signed)
Consult Note: Gyn-Onc  Consult was requested by Dr. Stann Mainland for the evaluation of Shelby Fitzpatrick 50 y.o. female  CC:  Chief Complaint  Patient presents with  . Ovarian Cyst    New Consutation    Assessment/Plan:  [name] is a 50 y.o.  with symptoms consistent with continued ovulatory events and serial ultrasounds the most recent of which notes the presence of a 2.2 cm left anterior complex ovarian cyst measuring 1.5 cm. Calcifications are seen along the wall and within the ovary. No free fluid is visualized and CA-125 is within normal limits.  Missed Shelby Fitzpatrick history is also notable for significant coronary artery disease and prior myocardial infarction.  The prior endometrial ablation makes interpretation of the ultrasound difficult. My recommendation is for an MRI of the pelvis to better evaluate the calcifications and walls of the adnexal cysts.  Will also repeat CA-125 and FSH.   We discussed that there is a very low indication at this time for laparoscopic BSO, particularly given her significant underlying coronary artery disease. However a diagnostic laparoscopy will be considered if the above additional evaluation is equivocal.    Follow-up in 3 months  HPI: [name] is a 50 y.o.  with a history of endometrial ablation who noted vaginal spotting in April and May 2017.  Pelvic ultrasounds  08/24/15: Uterus is midline.  No uterine masses are seen.  There is a 1.8 cm nabothian cyst.  Endometrium is mostly obscured.  Patient is s/p ablation.  Fundally the lining measures 6.9 mm.  Right ovary has calcifications.  Left ovary has three masses.  Two are solid appearing with soft internal echos.  One with fluid level measures 2.6 cm.  Simple appearing cyst measures 2.3 cm and solid without fluid line measures 2.5 cm.  No increase blood flow.  Scattered calcifications along the borders of all masses.  No free fluid. 09/11/15: Uterus is anteverted.  Lt LAT fibroid noted.  EM  measures 0.79 cm.  RO contains small calcifications.  LO has 3 cysts: 1= 1.8 cm (mixed), 2=1.5 cm (mixed), 3= 1.5 cm (simple).  Have all decreased in size from previous scan.  No FF is seen.  Nabothian cysts are seen in CX.  No latex allergy  12/04/15: Anteverted uterus with a 1.9 cm left lateral fibroid.  Endometrium is hard to define.  Patient is s/p ablation.  Right ovary has calcifications throughout and a cystic structure measuring 1.8 cm with calcifications in the wall.  No increase in Doppler flow.  Left ovary appears multicystic with cysts measuring 2.2 cm, 1.5 cm, 1.8 cm, and 1.4 cm.  Inferior complex cyst measures 2.2 cm and anterior complex cyst measures 1.5 cm.  Calcifications are seen along the wall and within the ovary.  No free fluid is visualized.  CA 125 12/05/2015 12  Review of Systems: Constitutional  Feels well,  Cardiovascular  No chest pain, shortness of breath, or edema  Pulmonary  No cough or wheeze.  Gastro Intestinal  No nausea, vomitting, or diarrhoea. No bright red blood per rectum, no abdominal pain, change in bowel movement, or constipation,  rare episodes of abdominal bloating, denies early satiety and reports excellent appetite Genito Urinary  No frequency, urgency, dysuria, no vaginal bleeding or discharge,. States that she can still identify when she is ovulating Musculo Skeletal  No myalgia, arthralgia, joint swelling or pain  Neurologic  No weakness, numbness, change in gait,  Psychology  No depression, anxiety, insomnia.  Current Meds:  Outpatient Encounter Prescriptions as of 01/03/2016  Medication Sig  . aspirin 81 MG tablet Take 1 tablet (81 mg total) by mouth daily.  [name] Kitchen aspirin-acetaminophen-caffeine (EXCEDRIN MIGRAINE) 250-250-65 MG per tablet Take 2 tablets by mouth every 6 (six) hours as needed.  [name] Kitchen atorvastatin (LIPITOR) 20 MG tablet TAKE 1 TABLET BY MOUTH DAILY  . cyclobenzaprine (FLEXERIL) 10 MG tablet TK 1 T PO QHS PRF MUSCLE SPASMSn in lower  back  . meclizine (ANTIVERT) 25 MG tablet Take 1 tablet (25 mg total) by mouth 3 (three) times daily as needed for dizziness.  . metoprolol tartrate (LOPRESSOR) 25 MG tablet TAKE 1 TABLET BY MOUTH TWICE DAILY  . nitroGLYCERIN (NITROSTAT) 0.4 MG SL tablet PLACE 1 TABLET UNDER THE TONGUE EVERY 5 MINUTES 3 TIMES AS NEEDED AS DIRECTED   No facility-administered encounter medications on file as of 01/03/2016.     Allergy:  Allergies  Allergen Reactions  . Penicillins     Social Hx:   Social History   Social History  . Marital status: Married    Spouse name: N/A  . Number of children: N/A  . Years of education: N/A   Occupational History  . Not on file.   Social History Main Topics  . Smoking status: Current Every Day Smoker    Types: Cigarettes  . Smokeless tobacco: Never Used  . Alcohol use 0.0 oz/week     Comment: rare  . Drug use: No  . Sexual activity: Not on file   Other Topics Concern  . Not on file   Social History Narrative  . No narrative on file    Past Surgical Hx:  Past Surgical History:  Procedure Laterality Date  . CARDIAC CATHETERIZATION N/A 05/29/2015   Procedure: Left Heart Cath and Coronary Angiography;  Surgeon: Jettie Booze, MD;  Location: [place] [facility];  Service: Cardiovascular;  Laterality: N/A;  . coronary intervention      drug-eluting stent placement-2011    Past Medical Hx:  Past Medical History:  Diagnosis Date  . CAD (coronary artery disease)    coronary intervention and drug-eluting stent placement STEMI-2011   Cath 02/25/2010:  100% RCA (PCI with Promus DES); minor LAD and CFX (nothing more than 20%)  EF 65% at cath 02/25/2010  . Ectopic pregnancy    (ruptured)  . Hyperlipemia     Past Gynecological History:Gravida 7 para 1 .  Laparoscopic salpingostomies on the left 05/2005 and a right in June 2001 for ectopic pregnancies  No LMP recorded. Patient has had an ablation. In May 2015  Pap 12/04/2015 Diagnosis NEGATIVE FOR  INTRAEPITHELIAL LESIONS OR MALIGNANCY. CELLULAR CHANGES CONSISTENT WITH HYPERKERATOSIS.  Family Hx:  Family History  Problem Relation Age of Onset  . Coronary artery disease Father   . Heart attack Father     Vitals:  Blood pressure (!) 156/90, pulse 65, temperature 98.8 F (37.1 C), temperature source Oral, resp. rate 18, weight 156 lb 14.4 oz (71.2 kg), SpO2 99 %.  Physical Exam: WD in NAD Neck  Supple NROM, without any enlargements.  Lymph Node Survey No cervical supraclavicular or inguinal adenopathy Cardiovascular  Pulse normal rate, regularity and rhythm. S1 and S2 normal.  Lungs  Clear to auscultation bilaterally. Good air movement.  Skin  No rash/lesions/breakdown  Psychiatry  Alert and oriented appropriate mood affect speech and reasoning. Abdomen  Normoactive bowel sounds, abdomen soft, non-tender.  Back No CVA tenderness Genito Urinary  Vulva/vagina: Normal external female genitalia.  No lesions.   Bladder/urethra:  No lesions or masses  Vagina: No bleeding or discharge  Cervix: Normal appearing, no lesions.  Uterus: Mobile, no parametrial involvement or nodularity.  Adnexa: No palpable masses. Rectal  Good tone, no masses no cul de sac nodularity.  Extremities  No bilateral cyanosis, clubbing or edema.   Janie Morning, MD, PhD .

## 2016-01-03 ENCOUNTER — Encounter: Payer: Self-pay | Admitting: Gynecologic Oncology

## 2016-01-03 ENCOUNTER — Ambulatory Visit: Payer: Managed Care, Other (non HMO) | Attending: Gynecologic Oncology | Admitting: Gynecologic Oncology

## 2016-01-03 VITALS — BP 156/90 | HR 65 | Temp 98.8°F | Resp 18 | Wt 156.9 lb

## 2016-01-03 DIAGNOSIS — N83299 Other ovarian cyst, unspecified side: Secondary | ICD-10-CM

## 2016-01-03 DIAGNOSIS — I252 Old myocardial infarction: Secondary | ICD-10-CM | POA: Diagnosis not present

## 2016-01-03 DIAGNOSIS — I2510 Atherosclerotic heart disease of native coronary artery without angina pectoris: Secondary | ICD-10-CM | POA: Diagnosis not present

## 2016-01-03 DIAGNOSIS — N83292 Other ovarian cyst, left side: Secondary | ICD-10-CM

## 2016-01-03 DIAGNOSIS — F17210 Nicotine dependence, cigarettes, uncomplicated: Secondary | ICD-10-CM | POA: Insufficient documentation

## 2016-01-03 DIAGNOSIS — E785 Hyperlipidemia, unspecified: Secondary | ICD-10-CM | POA: Insufficient documentation

## 2016-01-03 NOTE — Patient Instructions (Addendum)
Plan to have an ultrasound with CA 125 and [place] lab draw in three months prior to seeing Dr. Skeet Latch on November 30.  Please call for any questions or concerns.  We will contact you with an appointment date and time for the ultrasound and lab work once your insurance has prior authorized the ultrasound.  Please call for any questions or concerns.

## 2016-01-16 ENCOUNTER — Ambulatory Visit (HOSPITAL_COMMUNITY)
Admission: RE | Admit: 2016-01-16 | Discharge: 2016-01-16 | Disposition: A | Discharge status: Home / Self Care | Payer: Managed Care, Other (non HMO) | Source: Ambulatory Visit | Attending: Gynecologic Oncology | Admitting: Gynecologic Oncology

## 2016-01-16 DIAGNOSIS — N83291 Other ovarian cyst, right side: Secondary | ICD-10-CM | POA: Diagnosis not present

## 2016-01-16 DIAGNOSIS — N800 Endometriosis of uterus: Secondary | ICD-10-CM | POA: Insufficient documentation

## 2016-01-16 DIAGNOSIS — Z9889 Other specified postprocedural states: Secondary | ICD-10-CM | POA: Diagnosis not present

## 2016-01-16 DIAGNOSIS — N838 Other noninflammatory disorders of ovary, fallopian tube and broad ligament: Secondary | ICD-10-CM | POA: Diagnosis not present

## 2016-01-16 DIAGNOSIS — N83299 Other ovarian cyst, unspecified side: Secondary | ICD-10-CM | POA: Diagnosis present

## 2016-01-16 DIAGNOSIS — N83292 Other ovarian cyst, left side: Secondary | ICD-10-CM | POA: Diagnosis not present

## 2016-01-17 ENCOUNTER — Telehealth: Payer: Self-pay | Admitting: Gynecologic Oncology

## 2016-01-18 NOTE — Telephone Encounter (Signed)
Returned call to patient.  All questions answered about her ovarian cysts.  Advised to call for any needs or concerns.

## 2016-03-30 ENCOUNTER — Emergency Department (HOSPITAL_BASED_OUTPATIENT_CLINIC_OR_DEPARTMENT_OTHER)
Admission: EM | Admit: 2016-03-30 | Discharge: 2016-03-30 | Disposition: A | Discharge status: Home / Self Care | Payer: Managed Care, Other (non HMO) | Attending: Emergency Medicine | Admitting: Emergency Medicine

## 2016-03-30 ENCOUNTER — Encounter (HOSPITAL_BASED_OUTPATIENT_CLINIC_OR_DEPARTMENT_OTHER): Payer: Self-pay | Admitting: Emergency Medicine

## 2016-03-30 DIAGNOSIS — K219 Gastro-esophageal reflux disease without esophagitis: Secondary | ICD-10-CM

## 2016-03-30 DIAGNOSIS — I2510 Atherosclerotic heart disease of native coronary artery without angina pectoris: Secondary | ICD-10-CM | POA: Diagnosis not present

## 2016-03-30 DIAGNOSIS — I10 Essential (primary) hypertension: Secondary | ICD-10-CM | POA: Diagnosis not present

## 2016-03-30 DIAGNOSIS — Z79899 Other long term (current) drug therapy: Secondary | ICD-10-CM | POA: Insufficient documentation

## 2016-03-30 DIAGNOSIS — F17210 Nicotine dependence, cigarettes, uncomplicated: Secondary | ICD-10-CM | POA: Diagnosis not present

## 2016-03-30 DIAGNOSIS — Z7982 Long term (current) use of aspirin: Secondary | ICD-10-CM | POA: Insufficient documentation

## 2016-03-30 DIAGNOSIS — R1013 Epigastric pain: Secondary | ICD-10-CM | POA: Diagnosis present

## 2016-03-30 LAB — URINE MICROSCOPIC-ADD ON: WBC UA: NONE SEEN WBC/hpf (ref 0–5)

## 2016-03-30 LAB — COMPREHENSIVE METABOLIC PANEL
ALBUMIN: 4.5 g/dL (ref 3.5–5.0)
ALK PHOS: 67 U/L (ref 38–126)
ALT: 17 U/L (ref 14–54)
AST: 28 U/L (ref 15–41)
Anion gap: 9 (ref 5–15)
BILIRUBIN TOTAL: 0.6 mg/dL (ref 0.3–1.2)
BUN: 17 mg/dL (ref 6–20)
CALCIUM: 9.8 mg/dL (ref 8.9–10.3)
CO2: 23 mmol/L (ref 22–32)
CREATININE: 0.84 mg/dL (ref 0.44–1.00)
Chloride: 105 mmol/L (ref 101–111)
GFR calc Af Amer: >60 mL/min (ref >60)
GFR calc non Af Amer: >60 mL/min (ref >60)
GLUCOSE: 94 mg/dL (ref 65–99)
Potassium: 4.3 mmol/L (ref 3.5–5.1)
SODIUM: 137 mmol/L (ref 135–145)
Total Protein: 7.7 g/dL (ref 6.5–8.1)

## 2016-03-30 LAB — URINALYSIS, ROUTINE W REFLEX MICROSCOPIC
Glucose, UA: NEGATIVE mg/dL
KETONES UR: 15 mg/dL — AB
LEUKOCYTES UA: NEGATIVE
NITRITE: NEGATIVE
PH: 6 (ref 5.0–8.0)
PROTEIN: NEGATIVE mg/dL
Specific Gravity, Urine: 1.031 — ABNORMAL HIGH (ref 1.005–1.030)

## 2016-03-30 LAB — CBC
HEMATOCRIT: 46.1 % — AB (ref 36.0–46.0)
HEMOGLOBIN: 15.1 g/dL — AB (ref 12.0–15.0)
MCH: 30.7 pg (ref 26.0–34.0)
MCHC: 32.8 g/dL (ref 30.0–36.0)
MCV: 93.7 fL (ref 78.0–100.0)
Platelets: 207 10*3/uL (ref 150–400)
RBC: 4.92 MIL/uL (ref 3.87–5.11)
RDW: 13 % (ref 11.5–15.5)
WBC: 11.3 10*3/uL — ABNORMAL HIGH (ref 4.0–10.5)

## 2016-03-30 LAB — LIPASE, BLOOD: Lipase: 14 U/L (ref 11–51)

## 2016-03-30 LAB — TROPONIN I: Troponin I: <0.03 ng/mL (ref <0.03)

## 2016-03-30 MED ORDER — PANTOPRAZOLE SODIUM 20 MG PO TBEC: 20.0000 mg | DELAYED_RELEASE_TABLET | Freq: Every day | ORAL | 0 refills | Status: DC

## 2016-03-30 MED ORDER — ONDANSETRON HCL 4 MG PO TABS: 4.0000 mg | ORAL_TABLET | Freq: Four times a day (QID) | ORAL | 0 refills | Status: DC

## 2016-03-30 MED ORDER — ONDANSETRON 4 MG PO TBDP
4.0000 mg | ORAL_TABLET | Freq: Once | ORAL | Status: AC
  Administered 2016-03-30: 4 mg via ORAL
  Filled 2016-03-30: qty 1

## 2016-03-30 NOTE — Discharge Instructions (Signed)
Return to the ED with any concerns including vomiting and not able to keep down liquids or your medications, abdominal pain especially if it localizes to the right lower abdomen, fever or chills, and decreased urine output, decreased level of alertness or lethargy, or any other alarming symptoms.  °

## 2016-03-30 NOTE — ED Triage Notes (Signed)
Burning upper abd pain x 2 weeks, intermittent with nausea. Pt had vomiting Wednesday. Often hurts after eating lettuce.

## 2016-03-30 NOTE — ED Notes (Signed)
IV attempted x2 without success.

## 2016-03-30 NOTE — ED Provider Notes (Signed)
Shelby Fitzpatrick Provider Note   CSN: [id number] Arrival date & time: 03/30/16  1150     History   Chief Complaint Chief Complaint  Patient presents with  . Abdominal Pain    HPI Shelby Fitzpatrick is a 50 y.o. female.  HPI  Pt with hx of CAD, HTN presenting with c/o epigastric pain and nausea with vomiting.  No diarrhea.  Symptoms have been constant for the past 2 weeks.  Had episode of emesis 5 days ago.  Has had decreased appetite.  Feels abdomen gets bloated after eating small amounts of food.  Pain is burning in nature.  There are no other associated systemic symptoms, there are no other alleviating or modifying factors.   No chest pain, no shortnses of breath, no fainting.    Past Medical History:  Diagnosis Date  . CAD (coronary artery disease)    coronary intervention and drug-eluting stent placement STEMI-2011   Cath 02/25/2010:  100% RCA (PCI with Promus DES); minor LAD and CFX (nothing more than 20%)  EF 65% at cath 02/25/2010  . Ectopic pregnancy    (ruptured)  . Hyperlipemia   . Hypertension   . Myocardial infarction     Patient Active Problem List   Diagnosis Date Noted  . Angina pectoris ([place])   . Dizziness 08/29/2014  . Nicotine addiction 01/18/2012  . HYPERLIPIDEMIA 03/13/2010  . 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 CORONARY ATHEROSCLEROSIS NATIVE CORONARY ARTERY 03/13/2010  . DIZZINESS 03/13/2010    Past Surgical History:  Procedure Laterality Date  . ANKLE SURGERY     broken and had surgery to reapair  . APPENDECTOMY     at age 76  . CARDIAC CATHETERIZATION N/A 05/29/2015   Procedure: Left Heart Cath and Coronary Angiography;  Surgeon: Jettie Booze, MD;  Location: [facility];  Service: Cardiovascular;  Laterality: N/A;  . coronary intervention      drug-eluting stent placement-2011  . tubal pregnancy x2     cant recall year    OB History    No data available       Home Medications    Prior to  Admission medications   Medication Sig Start Date End Date Taking? Authorizing Provider  aspirin 81 MG tablet Take 1 tablet (81 mg total) by mouth daily. 07/29/10  Yes Sherren Mocha, MD  atorvastatin (LIPITOR) 20 MG tablet TAKE 1 TABLET BY MOUTH DAILY 09/07/15  Yes Sherren Mocha, MD  metoprolol tartrate (LOPRESSOR) 25 MG tablet TAKE 1 TABLET BY MOUTH TWICE DAILY 06/15/15  Yes Sherren Mocha, MD  aspirin-acetaminophen-caffeine [facility] MIGRAINE) [phone] MG per tablet Take 2 tablets by mouth every 6 (six) hours as needed.    Historical Provider, MD  cyclobenzaprine (FLEXERIL) 10 MG tablet TK 1 T PO QHS PRF MUSCLE SPASMSn in lower back 03/16/15   Historical Provider, MD  meclizine (ANTIVERT) 25 MG tablet Take 1 tablet (25 mg total) by mouth 3 (three) times daily as needed for dizziness. Patient not taking: Reported on 01/03/2016 08/29/14   [name], PA-C  nitroGLYCERIN (NITROSTAT) 0.4 MG SL tablet PLACE 1 TABLET UNDER THE TONGUE EVERY 5 MINUTES 3 TIMES AS NEEDED AS DIRECTED Patient not taking: Reported on 01/03/2016 05/28/15   Burtis Junes, NP  ondansetron (ZOFRAN) 4 MG tablet Take 1 tablet (4 mg total) by mouth every 6 (six) hours. 03/30/16   Alfonzo Beers, MD  pantoprazole (PROTONIX) 20 MG tablet Take 1 tablet (20 mg total) by mouth daily. 03/30/16  Alfonzo Beers, MD    Family History Family History  Problem Relation Age of Onset  . Coronary artery disease Father   . Heart attack Father   . Hypertension Father   . Stroke Father   . Hypertension Mother     Social History Social History  Substance Use Topics  . Smoking status: Current Every Day Smoker    Packs/day: 0.50    Years: 35.00    Types: Cigarettes  . Smokeless tobacco: Never Used  . Alcohol use 0.0 oz/week     Comment: rare     Allergies   Penicillins   Review of Systems Review of Systems  ROS reviewed and all otherwise negative except for mentioned in HPI   Physical Exam Updated Vital Signs BP 125/85  (BP Location: Left Arm)   Pulse 60   Temp 98.4 F (36.9 C) (Oral)   Resp 16   Ht 5\' 7"  (1.702 m)   Wt 155 lb (70.3 kg)   LMP 02/24/2013   SpO2 98%   BMI 24.28 kg/m  Vitals reviewed Physical Exam Physical Examination: General appearance - alert, well appearing, and in no distress Mental status - alert, oriented to person, place, and time Eyes - no conjunctival injection, no scleral icterus Mouth - mucous membranes moist, pharynx normal without lesions Chest - clear to auscultation, no wheezes, rales or rhonchi, symmetric air entry Heart - normal rate, regular rhythm, normal S1, S2, no murmurs, rubs, clicks or gallops Abdomen - soft, nontender, nondistended, no masses or organomegaly Neurological - alert, oriented, normal speech Extremities - peripheral pulses normal, no pedal edema, no clubbing or cyanosis Skin - normal coloration and turgor, no rashes  ED Treatments / Results  Labs (all labs ordered are listed, but only abnormal results are displayed) Labs Reviewed  URINALYSIS, ROUTINE W REFLEX MICROSCOPIC (NOT AT [facility]) - Abnormal; Notable for the following:       Result Value   Color, Urine AMBER (*)    Specific Gravity, Urine 1.031 (*)    Hgb urine dipstick LARGE (*)    Bilirubin Urine SMALL (*)    Ketones, ur 15 (*)    All other components within normal limits  URINE MICROSCOPIC-ADD ON - Abnormal; Notable for the following:    Squamous Epithelial / LPF 0-5 (*)    Bacteria, UA MANY (*)    Casts HYALINE CASTS (*)    Crystals URIC ACID CRYSTALS (*)    All other components within normal limits  CBC - Abnormal; Notable for the following:    WBC 11.3 (*)    Hemoglobin 15.1 (*)    HCT 46.1 (*)    All other components within normal limits  URINE CULTURE  COMPREHENSIVE METABOLIC PANEL  LIPASE, BLOOD  TROPONIN I    EKG  EKG Interpretation  Date/Time:  Sunday March 30 2016 13:59:16 EST Ventricular Rate:  59 PR Interval:    QRS Duration: 93 QT Interval:  397 QTC  Calculation: 394 R Axis:   8 Text Interpretation:  Sinus rhythm Probable anteroseptal infarct, old Baseline wander in lead(s) V1 V2 No significant change since last tracing Confirmed by Canary Brim  MD, Sherol Sabas [phone]) on 03/30/2016 2:26:46 PM       Radiology No results found.  Procedures Procedures (including critical care time)  Medications Ordered in ED Medications  ondansetron (ZOFRAN-ODT) disintegrating tablet 4 mg (4 mg Oral Given 03/30/16 1358)     Initial Impression / Assessment and Plan / ED Course  I have reviewed  the triage vital signs and the nursing notes.  Pertinent labs & imaging results that were available during my care of the patient were reviewed by me and considered in my medical decision making (see chart for details).  Clinical Course     Pt presenting with epigastric burning and nausea.  Labs are reassuring, mild epigastric tenderness.  No chest pain- pt has hx of MI - troponin negative- pain has been constant for 2 weeks so ACS ruled out at this time.  EKG also normal.  Doubt cholecystitis or pancreatitis- will given trial of protonix.  Also advised f/u with PMD.  Discharged with strict return precautions.  Pt agreeable with plan.  Final Clinical Impressions(s) / ED Diagnoses   Final diagnoses:  Gastroesophageal reflux disease, esophagitis presence not specified    New Prescriptions Discharge Medication List as of 03/30/2016  3:22 PM    START taking these medications   Details  pantoprazole (PROTONIX) 20 MG tablet Take 1 tablet (20 mg total) by mouth daily., Starting Sun 03/30/2016, Print         Alfonzo Beers, MD 03/30/16 [phone]

## 2016-04-01 ENCOUNTER — Other Ambulatory Visit (HOSPITAL_BASED_OUTPATIENT_CLINIC_OR_DEPARTMENT_OTHER): Payer: Managed Care, Other (non HMO)

## 2016-04-01 ENCOUNTER — Telehealth: Payer: Self-pay | Admitting: Emergency Medicine

## 2016-04-01 ENCOUNTER — Ambulatory Visit (HOSPITAL_COMMUNITY)
Admission: RE | Admit: 2016-04-01 | Discharge: 2016-04-01 | Disposition: A | Discharge status: Home / Self Care | Payer: Managed Care, Other (non HMO) | Source: Ambulatory Visit | Attending: Gynecologic Oncology | Admitting: Gynecologic Oncology

## 2016-04-01 DIAGNOSIS — N83292 Other ovarian cyst, left side: Secondary | ICD-10-CM | POA: Diagnosis not present

## 2016-04-01 DIAGNOSIS — N83291 Other ovarian cyst, right side: Secondary | ICD-10-CM | POA: Insufficient documentation

## 2016-04-01 DIAGNOSIS — N83299 Other ovarian cyst, unspecified side: Secondary | ICD-10-CM | POA: Diagnosis not present

## 2016-04-01 LAB — URINE CULTURE: Culture: NO GROWTH

## 2016-04-01 NOTE — Telephone Encounter (Signed)
Called pt and LMOVM in regards to the message below.   Patient seen in ER. Diagnosed with GERD. Please see that she is taking medication as directed by ER provider. See how she is feeling. Recommend ER follow-up within 10 days.     ----- Message -----  From: Judd Gaudier, RN  Sent: 03/30/2016  3:44 PM  To: [name], PA-C

## 2016-04-01 NOTE — Progress Notes (Signed)
GYN ONCOLOGY OFFICE VISIT  Shelby Fitzpatrick 50 y.o. female  CC:  Chief Complaint  Patient presents with  . Ovarian Cyst    Assessment/Plan:  [name] is a 50 y.o.  with symptoms consistent with continued ovulatory events and serial ultrasounds the most recent of which notes the presence of a 2.2 cm left anterior complex ovarian cyst measuring 1.5 cm. Calcifications are seen along the wall and within the ovary. No free fluid is visualized and CA-125 is within normal limits.  Missed [place] history is also notable for significant coronary artery disease and prior myocardial infarction.  The prior endometrial ablation makes interpretation of the ultrasound difficult.  MRI of the pelvis to better evaluate the calcifications and walls of the adnexal cysts.  Will also repeat CA-125 and FSH.   We discussed that there is a very low indication at this time for laparoscopic BSO, particularly given her significant underlying coronary artery disease. 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 is a 50 y.o.  with a history of endometrial ablation who noted vaginal spotting in April and May 2017.  Pelvic ultrasounds  08/24/15: Uterus is midline.  No uterine masses are seen.  There is a 1.8 cm nabothian cyst.  Endometrium is mostly obscured.  Patient is s/p ablation.  Fundally the lining measures 6.9 mm.  Right ovary has calcifications.  Left ovary has three masses.  Two are solid appearing with soft internal echos.  One with fluid level measures 2.6 cm.  Simple appearing cyst measures 2.3 cm and solid without fluid line measures 2.5 cm.  No increase blood flow.  Scattered calcifications along the borders of all masses.  No free fluid. 09/11/15: Uterus is anteverted.  Lt LAT fibroid noted.  EM measures 0.79 cm.  RO contains small calcifications.  LO has 3 cysts: 1=  1.8 cm (mixed), 2=1.5 cm (mixed), 3= 1.5 cm (simple).  Have all decreased in size from previous scan.  No FF is seen.  Nabothian cysts are seen in CX.  No latex allergy  12/04/15: Anteverted uterus with a 1.9 cm left lateral fibroid.  Endometrium is hard to define.  Patient is s/p ablation.  Right ovary has calcifications throughout and a cystic structure measuring 1.8 cm with calcifications in the wall.  No increase in Doppler flow.  Left ovary appears multicystic with cysts measuring 2.2 cm, 1.5 cm, 1.8 cm, and 1.4 cm.  Inferior complex cyst measures 2.2 cm and anterior complex cyst measures 1.5 cm.  Calcifications are seen along the wall and within the ovary.  No free fluid is visualized.  CA 125 12/05/2015 12 Interval history:  Reports pelvic discomfort earlier this week so she underwent a pelvic ultrasound 04/01/2016 Right ovary Measurements: 3.3 x 1.8 x 2.3 cm. Cystic lesion is noted within the right ovary measuring 1.8 cm. This is simple in appearance and stable from the prior exam. The echogenic focus within the right ovary seen on the prior exam is less well appreciated on the current study.  Left ovary Measurements: 3.3 x 2.1 x 2.3 cm. The simple cystic lesion seen previously is again identified and measures 1.9 x 1.6 x 1.7 cm. It demonstrates a a area of increased echogenicity within which may represent a hemorrhagic cyst.  The smaller lesion seen on the prior ultrasound examination is less well appreciated. Reports discomfort with ovulation   Review of Systems:  Constitutional  Feels well,  Cardiovascular  No chest pain, shortness of breath, or edema  Pulmonary  No cough or wheeze.  Gastro Intestinal  No nausea, vomitting, or diarrhoea. No bright red blood per rectum, no abdominal pain, change in bowel movement, or constipation,  rare episodes of abdominal bloating, denies early satiety and reports excellent appetite Genito Urinary  No frequency, urgency, dysuria, no vaginal  bleeding or discharge. States that she can still identify when she is ovulating and its associated with significant discomfort. Musculo Skeletal  No myalgia, arthralgia, joint swelling or pain  Neurologic  No weakness, numbness, change in gait,  Psychology  No depression, anxiety, insomnia.    Social Hx:   Social History   Social History  . Marital status: Married    Spouse name: N/A  . Number of children: N/A  . Years of education: N/A   Occupational History  . Not on file.   Social History Main Topics  . Smoking status: Current Every Day Smoker    Packs/day: 0.50    Years: 35.00    Types: Cigarettes  . Smokeless tobacco: Never Used  . Alcohol use 0.0 oz/week     Comment: rare  . Drug use: No  . Sexual activity: Not on file   Other Topics Concern  . Not on file   Social History Narrative  . No narrative on file    Past Surgical Hx:  Past Surgical History:  Procedure Laterality Date  . ANKLE SURGERY     broken and had surgery to reapair  . APPENDECTOMY     at age 24  . CARDIAC CATHETERIZATION N/A 05/29/2015   Procedure: Left Heart Cath and Coronary Angiography;  Surgeon: Jettie Booze, MD;  Location: [place] [facility];  Service: Cardiovascular;  Laterality: N/A;  . coronary intervention      drug-eluting stent placement-2011  . tubal pregnancy x2     cant recall year    Past Medical Hx:  Past Medical History:  Diagnosis Date  . CAD (coronary artery disease)    coronary intervention and drug-eluting stent placement STEMI-2011   Cath 02/25/2010:  100% RCA (PCI with Promus DES); minor LAD and CFX (nothing more than 20%)  EF 65% at cath 02/25/2010  . Ectopic pregnancy    (ruptured)  . Hyperlipemia   . Hypertension   . Myocardial infarction     Past Gynecological History:Gravida 7 para 1 .  Laparoscopic salpingostomies on the left 05/2005 and a right in June 2001 for ectopic pregnancies  Patient's last menstrual period was 02/24/2013. In May  2015  Pap 12/04/2015 Diagnosis NEGATIVE FOR INTRAEPITHELIAL LESIONS OR MALIGNANCY. CELLULAR CHANGES CONSISTENT WITH HYPERKERATOSIS.  Interval history:  Family Hx:  Family History  Problem Relation Age of Onset  . Coronary artery disease Father   . Heart attack Father   . Hypertension Father   . Stroke Father   . Hypertension Mother     Vitals:  Last menstrual period 02/24/2013.  Physical Exam: WD in NAD Neck  Supple NROM, without any enlargements.  Lymph Node Survey No cervical supraclavicular or inguinal adenopathy Cardiovascular  Pulse normal rate, regularity and rhythm. S1 and S2 normal.  Lungs  Clear to auscultation bilaterally. Good air movement.  Skin  No rash/lesions/breakdown  Psychiatry  Alert and oriented appropriate mood affect speech and reasoning. Abdomen  Normoactive bowel sounds, abdomen soft, non-tender.  Back No CVA tenderness Genito Urinary  Vulva/vagina: Normal external female genitalia.  No lesions.  Bladder/urethra:  No lesions or masses  Vagina: No bleeding or discharge  Cervix: Normal appearing, no lesions.  Uterus: Mobile, no parametrial involvement or nodularity.  Adnexa: No palpable masses. Rectal  Good tone, no masses no cul de sac nodularity.  Extremities  No bilateral cyanosis, clubbing or edema.

## 2016-04-02 LAB — FOLLICLE STIMULATING HORMONE: FSH: 56.6 m[IU]/mL

## 2016-04-02 LAB — CA 125: Cancer Antigen (CA) 125: 9.9 U/mL (ref 0.0–38.1)

## 2016-04-03 ENCOUNTER — Encounter: Payer: Self-pay | Admitting: Gynecologic Oncology

## 2016-04-03 ENCOUNTER — Ambulatory Visit: Payer: Managed Care, Other (non HMO) | Attending: Gynecologic Oncology | Admitting: Gynecologic Oncology

## 2016-04-03 VITALS — BP 145/93 | HR 75 | Temp 98.0°F | Resp 18 | Ht 67.0 in | Wt 154.8 lb

## 2016-04-03 DIAGNOSIS — I2510 Atherosclerotic heart disease of native coronary artery without angina pectoris: Secondary | ICD-10-CM | POA: Insufficient documentation

## 2016-04-03 DIAGNOSIS — I10 Essential (primary) hypertension: Secondary | ICD-10-CM | POA: Diagnosis not present

## 2016-04-03 DIAGNOSIS — Z823 Family history of stroke: Secondary | ICD-10-CM | POA: Insufficient documentation

## 2016-04-03 DIAGNOSIS — N83299 Other ovarian cyst, unspecified side: Secondary | ICD-10-CM

## 2016-04-03 DIAGNOSIS — E785 Hyperlipidemia, unspecified: Secondary | ICD-10-CM | POA: Insufficient documentation

## 2016-04-03 DIAGNOSIS — Z8249 Family history of ischemic heart disease and other diseases of the circulatory system: Secondary | ICD-10-CM | POA: Diagnosis not present

## 2016-04-03 DIAGNOSIS — N83292 Other ovarian cyst, left side: Secondary | ICD-10-CM | POA: Insufficient documentation

## 2016-04-03 DIAGNOSIS — R102 Pelvic and perineal pain: Secondary | ICD-10-CM | POA: Diagnosis not present

## 2016-04-03 DIAGNOSIS — I252 Old myocardial infarction: Secondary | ICD-10-CM | POA: Insufficient documentation

## 2016-04-03 DIAGNOSIS — F17210 Nicotine dependence, cigarettes, uncomplicated: Secondary | ICD-10-CM | POA: Insufficient documentation

## 2016-04-03 NOTE — Patient Instructions (Signed)
Use a fleets enema, stool softeners, prunes, milk or magnesia  Constipation, Adult Constipation is when a person:  Poops (has a bowel movement) fewer times in a week than normal.  Has a hard time pooping.  Has poop that is dry, hard, or bigger than normal. Follow these instructions at home: Eating and drinking  Eat foods that have a lot of fiber, such as:  Fresh fruits and vegetables.  Whole grains.  Beans.  Eat less of foods that are high in fat, low in fiber, or overly processed, such as:  [place] fries.  Hamburgers.  Cookies.  Candy.  Soda.  Drink enough fluid to keep your pee (urine) clear or pale yellow. General instructions  Exercise regularly or as told by your doctor.  Go to the restroom when you feel like you need to poop. Do not hold it in.  Take over-the-counter and prescription medicines only as told by your doctor. These include any fiber supplements.  Do pelvic floor retraining exercises, such as:  Doing deep breathing while relaxing your lower belly (abdomen).  Relaxing your pelvic floor while pooping.  Watch your condition for any changes.  Keep all follow-up visits as told by your doctor. This is important. Contact a doctor if:  You have pain that gets worse.  You have a fever.  You have not pooped for 4 days.  You throw up (vomit).  You are not hungry.  You lose weight.  You are bleeding from the anus.  You have thin, pencil-like poop (stool). Get help right away if:  You have a fever, and your symptoms suddenly get worse.  You leak poop or have blood in your poop.  Your belly feels hard or bigger than normal (is bloated).  You have very bad belly pain.  You feel dizzy or you faint. This information is not intended to replace advice given to you by your health care provider. Make sure you discuss any questions you have with your health care provider. Document Released: 10/08/2007 Document Revised: 11/09/2015 Document  Reviewed: 10/10/2015 Elsevier Interactive Patient Education  2017 [place].  Follow up with your cardiologist to discuss risks related to surgery.  Follow up with Dr. Skeet Latch in jan/Feb 2018

## 2016-04-08 ENCOUNTER — Encounter: Payer: Self-pay | Admitting: Cardiovascular Disease

## 2016-04-09 ENCOUNTER — Encounter: Payer: Self-pay | Admitting: Emergency Medicine

## 2016-04-10 NOTE — Telephone Encounter (Signed)
Would recommend follow-up in office to reassess.

## 2016-04-15 ENCOUNTER — Ambulatory Visit (INDEPENDENT_AMBULATORY_CARE_PROVIDER_SITE_OTHER): Payer: Managed Care, Other (non HMO) | Admitting: Physician Assistant

## 2016-04-15 ENCOUNTER — Encounter: Payer: Self-pay | Admitting: Physician Assistant

## 2016-04-15 VITALS — BP 132/82 | HR 78 | Temp 98.2°F | Resp 14 | Ht 67.0 in | Wt 153.0 lb

## 2016-04-15 DIAGNOSIS — R1013 Epigastric pain: Secondary | ICD-10-CM

## 2016-04-15 DIAGNOSIS — K5900 Constipation, unspecified: Secondary | ICD-10-CM | POA: Diagnosis not present

## 2016-04-15 LAB — COMPREHENSIVE METABOLIC PANEL
ALK PHOS: 82 U/L (ref 39–117)
ALT: 12 U/L (ref 0–35)
AST: 17 U/L (ref 0–37)
Albumin: 4.5 g/dL (ref 3.5–5.2)
BILIRUBIN TOTAL: 0.3 mg/dL (ref 0.2–1.2)
BUN: 10 mg/dL (ref 6–23)
CO2: 29 meq/L (ref 19–32)
Calcium: 10.9 mg/dL — ABNORMAL HIGH (ref 8.4–10.5)
Chloride: 103 mEq/L (ref 96–112)
Creatinine, Ser: 0.79 mg/dL (ref 0.40–1.20)
GFR: 82.27 mL/min (ref >60.00)
GLUCOSE: 90 mg/dL (ref 70–99)
Potassium: 4.4 mEq/L (ref 3.5–5.1)
SODIUM: 140 meq/L (ref 135–145)
TOTAL PROTEIN: 7.3 g/dL (ref 6.0–8.3)

## 2016-04-15 LAB — LIPASE: LIPASE: 16 U/L (ref 11.0–59.0)

## 2016-04-15 LAB — TSH: TSH: 2.75 u[IU]/mL (ref 0.35–4.50)

## 2016-04-15 LAB — H. PYLORI ANTIBODY, IGG: H Pylori IgG: NEGATIVE

## 2016-04-15 MED ORDER — ESOMEPRAZOLE MAGNESIUM 40 MG PO CPDR: 40.0000 mg | DELAYED_RELEASE_CAPSULE | Freq: Every day | ORAL | 1 refills | Status: DC

## 2016-04-15 NOTE — Progress Notes (Signed)
Patient presents to clinic today for ER follow-up of epigastric pain.  Patient presented to ER on 03/20/16 with c/o epigastric pain, nausea and vomiting x 2 weeks. ER workup included EKG revealing sinus rhythm and unremarkable labs.  Patient was placed on Protonix and has taken as directed for 2 weeks. Denies improvement in symptoms with medication so she stopped them. Patient endorses continued epigastric pain, milder but constant. Notes occasional nausea without vomiting. Denies alcohol consumption. Endorses excedrin migraine use regularly for aches/pains. Patient notes chronic constipation since 20s. Endorses 1-2 bowel movements every 7-10 days. Endorses some hard stools. Denies rectal bleeding or tenesmus. Notes good fluid intake. Denies change to diet.  Patient also with some chronic pelvic pain. Is being followed by Gynecology for uterine fibroids.   Past Medical History:  Diagnosis Date  . CAD (coronary artery disease)    coronary intervention and drug-eluting stent placement STEMI-2011   Cath 02/25/2010:  100% RCA (PCI with Promus DES); minor LAD and CFX (nothing more than 20%)  EF 65% at cath 02/25/2010  . Ectopic pregnancy    (ruptured)  . Hyperlipemia   . Hypertension   . Myocardial infarction     Current Outpatient Prescriptions on File Prior to Visit  Medication Sig Dispense Refill  . aspirin 81 MG tablet Take 1 tablet (81 mg total) by mouth daily.    [name] Kitchen aspirin-acetaminophen-caffeine (EXCEDRIN MIGRAINE) 250-250-65 MG per tablet Take 2 tablets by mouth every 6 (six) hours as needed.    [name] Kitchen atorvastatin (LIPITOR) 20 MG tablet TAKE 1 TABLET BY MOUTH DAILY 30 tablet 9  . cyclobenzaprine (FLEXERIL) 10 MG tablet TK 1 T PO QHS PRF MUSCLE SPASMSn in lower back  0  . metoprolol tartrate (LOPRESSOR) 25 MG tablet TAKE 1 TABLET BY MOUTH TWICE DAILY 60 tablet 11  . nitroGLYCERIN (NITROSTAT) 0.4 MG SL tablet PLACE 1 TABLET UNDER THE TONGUE EVERY 5 MINUTES 3 TIMES AS NEEDED AS DIRECTED 25 tablet  4  . pantoprazole (PROTONIX) 20 MG tablet Take 1 tablet (20 mg total) by mouth daily. (Patient not taking: Reported on 04/15/2016) 30 tablet 0   No current facility-administered medications on file prior to visit.     Allergies  Allergen Reactions  . Penicillins     Family History  Problem Relation Age of Onset  . Coronary artery disease Father   . Heart attack Father   . Hypertension Father   . Stroke Father   . Hypertension Mother     Social History   Social History  . Marital status: Married    Spouse name: N/A  . Number of children: N/A  . Years of education: N/A   Social History Main Topics  . Smoking status: Current Every Day Smoker    Packs/day: 0.50    Years: 35.00    Types: Cigarettes  . Smokeless tobacco: Never Used  . Alcohol use 0.0 oz/week     Comment: rare  . Drug use: No  . Sexual activity: Not Asked   Other Topics Concern  . None   Social History Narrative  . None   Review of Systems - See HPI.  All other ROS are negative.  BP 132/82   Pulse 78   Temp 98.2 F (36.8 C) (Oral)   Resp 14   Ht '5\' 7"'  (1.702 m)   Wt 153 lb (69.4 kg)   LMP 02/24/2013   SpO2 96%   BMI 23.96 kg/m   Physical Exam  Constitutional: She is oriented  to person, place, and time and well-developed, well-nourished, and in no distress.  HENT:  Head: Normocephalic and atraumatic.  Eyes: Conjunctivae are normal.  Neck: Neck supple.  Cardiovascular: Normal rate, regular rhythm, normal heart sounds and intact distal pulses.   Pulmonary/Chest: Breath sounds normal. No respiratory distress. She has no wheezes. She has no rales. She exhibits no tenderness.  Abdominal: Soft. Bowel sounds are normal. She exhibits no distension and no mass. There is tenderness in the epigastric area. There is no rebound and no guarding.  Neurological: She is alert and oriented to person, place, and time.  Skin: Skin is warm and dry. No rash noted.  Psychiatric: Affect normal.  Vitals  reviewed.  Recent Results (from the past 2160 hour(s))  Urinalysis, Routine w reflex microscopic (not at [facility])     Status: Abnormal   Collection Time: 03/30/16 12:30 PM  Result Value Ref Range   Color, Urine AMBER (A) YELLOW    Comment: BIOCHEMICALS MAY BE AFFECTED BY COLOR   APPearance CLEAR CLEAR   Specific Gravity, Urine 1.031 (H) 1.005 - 1.030   pH 6.0 5.0 - 8.0   Glucose, UA NEGATIVE NEGATIVE mg/dL   Hgb urine dipstick LARGE (A) NEGATIVE   Bilirubin Urine SMALL (A) NEGATIVE   Ketones, ur 15 (A) NEGATIVE mg/dL   Protein, ur NEGATIVE NEGATIVE mg/dL   Nitrite NEGATIVE NEGATIVE   Leukocytes, UA NEGATIVE NEGATIVE  Urine microscopic-add on     Status: Abnormal   Collection Time: 03/30/16 12:30 PM  Result Value Ref Range   Squamous Epithelial / LPF 0-5 (A) NONE SEEN   WBC, UA NONE SEEN 0 - 5 WBC/hpf   RBC / HPF 6-30 0 - 5 RBC/hpf   Bacteria, UA MANY (A) NONE SEEN   Casts HYALINE CASTS (A) NEGATIVE    Comment: GRANULAR CAST   Crystals URIC ACID CRYSTALS (A) NEGATIVE   Urine-Other MUCOUS PRESENT   Urine culture     Status: None   Collection Time: 03/30/16 12:30 PM  Result Value Ref Range   Specimen Description URINE, RANDOM    Special Requests NONE    Culture NO GROWTH Performed at [facility]     Report Status 04/01/2016 FINAL   CBC     Status: Abnormal   Collection Time: 03/30/16  1:33 PM  Result Value Ref Range   WBC 11.3 (H) 4.0 - 10.5 K/uL   RBC 4.92 3.87 - 5.11 MIL/uL   Hemoglobin 15.1 (H) 12.0 - 15.0 g/dL   HCT 46.1 (H) 36.0 - 46.0 %   MCV 93.7 78.0 - 100.0 fL   MCH 30.7 26.0 - 34.0 pg   MCHC 32.8 30.0 - 36.0 g/dL   RDW 13.0 11.5 - 15.5 %   Platelets 207 150 - 400 K/uL  Comprehensive metabolic panel     Status: None   Collection Time: 03/30/16  1:33 PM  Result Value Ref Range   Sodium 137 135 - 145 mmol/L   Potassium 4.3 3.5 - 5.1 mmol/L   Chloride 105 101 - 111 mmol/L   CO2 23 22 - 32 mmol/L   Glucose, Bld 94 65 - 99 mg/dL   BUN 17 6 - 20 mg/dL    Creatinine, Ser 0.84 0.44 - 1.00 mg/dL   Calcium 9.8 8.9 - 10.3 mg/dL   Total Protein 7.7 6.5 - 8.1 g/dL   Albumin 4.5 3.5 - 5.0 g/dL   AST 28 15 - 41 U/L   ALT 17 14 - 54  U/L   Alkaline Phosphatase 67 38 - 126 U/L   Total Bilirubin 0.6 0.3 - 1.2 mg/dL   GFR calc non Af Amer >60 >60 mL/min   GFR calc Af Amer >60 >60 mL/min    Comment: (NOTE) The eGFR has been calculated using the CKD EPI equation. This calculation has not been validated in all clinical situations. eGFR's persistently <60 mL/min signify possible Chronic Kidney Disease.    Anion gap 9 5 - 15  Lipase, blood     Status: None   Collection Time: 03/30/16  1:33 PM  Result Value Ref Range   Lipase 14 11 - 51 U/L  Troponin I     Status: None   Collection Time: 03/30/16  2:25 PM  Result Value Ref Range   Troponin I <0.03 <0.03 ng/mL  CA 125     Status: None   Collection Time: 04/01/16 11:58 AM  Result Value Ref Range   Cancer Antigen (CA) 125 9.9 0.0 - 38.1 U/mL    Comment: Roche ECLIA methodology  Follicle Stimulating Hormone     Status: None   Collection Time: 04/01/16 11:58 AM  Result Value Ref Range   FSH 56.6 mIU/mL    Comment:                                    Adult Female:                                      Follicular phase      3.5 -  12.5                                      Ovulation phase       4.7 -  21.5                                      Luteal phase          1.7 -   7.7                                      Postmenopausal       25.8 - 134.8    Assessment/Plan: 1. Epigastric pain Unclear etiology. Suspect gastritis secondary to NSAID use. Stop Excedrin. Start Nexium and probiotic. Will repeat some labs today and check H. Pylori. If unremarkable, will need GI assessment and EGD.  - H. pylori antibody, IgG - esomeprazole (NEXIUM) 40 MG capsule; Take 1 capsule (40 mg total) by mouth daily at 12 noon.  Dispense: 30 capsule; Refill: 1 - Lipase - Comp Met (CMET)  2. Constipation, unspecified  constipation type Chronic. Will start bowel regimen. Will check TSH today. GI referral.  - Comp Met (CMET) - TSH   Leeanne Rio, PA-C

## 2016-04-15 NOTE — Progress Notes (Signed)
Pre visit review using our clinic review tool, if applicable. No additional management support is needed unless otherwise documented below in the visit note. 

## 2016-04-15 NOTE — Patient Instructions (Signed)
Please go to the lab for blood work.  I will call you with your results.  Please start a probiotic. Start the NExium as directed.  I encourage you to increase hydration and the amount of fiber in your diet.  Start a daily probiotic (Align, Culturelle, Digestive Advantage, etc.). If no bowel movement within 24 hours, take 2 Tbs of Milk of Magnesia in a 4 oz glass of warmed prune juice every 2-3 days to help promote bowel movement. If no results within 24 hours, then repeat above regimen, adding a Dulcolax stool softener to regimen. If this does not promote a bowel movement, please call the office.  Cut back on the Excedrin use. Use Tylenol instead.   We will alter regimen based on results.  If all looks good but symptoms are not improving, I would want to set you up with Gastroenterology.

## 2016-04-16 ENCOUNTER — Other Ambulatory Visit: Payer: Self-pay | Admitting: Physician Assistant

## 2016-04-16 ENCOUNTER — Encounter: Payer: Self-pay | Admitting: Gastroenterology

## 2016-04-16 DIAGNOSIS — G8929 Other chronic pain: Secondary | ICD-10-CM

## 2016-04-16 DIAGNOSIS — R1013 Epigastric pain: Secondary | ICD-10-CM

## 2016-04-16 DIAGNOSIS — K5909 Other constipation: Secondary | ICD-10-CM

## 2016-04-24 ENCOUNTER — Ambulatory Visit: Payer: Managed Care, Other (non HMO) | Admitting: Gastroenterology

## 2016-05-07 ENCOUNTER — Ambulatory Visit (INDEPENDENT_AMBULATORY_CARE_PROVIDER_SITE_OTHER): Payer: Managed Care, Other (non HMO) | Admitting: Gastroenterology

## 2016-05-07 ENCOUNTER — Encounter: Payer: Self-pay | Admitting: Gastroenterology

## 2016-05-07 VITALS — BP 118/84 | HR 72 | Ht 67.0 in | Wt 158.5 lb

## 2016-05-07 DIAGNOSIS — K5900 Constipation, unspecified: Secondary | ICD-10-CM

## 2016-05-07 DIAGNOSIS — R1013 Epigastric pain: Secondary | ICD-10-CM

## 2016-05-07 DIAGNOSIS — Z791 Long term (current) use of non-steroidal anti-inflammatories (NSAID): Secondary | ICD-10-CM

## 2016-05-07 DIAGNOSIS — R110 Nausea: Secondary | ICD-10-CM

## 2016-05-07 NOTE — Patient Instructions (Signed)
Miralax 1-2 times daily.  Discontinue Excedrin if possible.    Continue Nexium daily.   You have been scheduled for an endoscopy. Please follow written instructions given to you at your visit today. If you use inhalers (even only as needed), please bring them with you on the day of your procedure. Your physician has requested that you go to www.startemmi.com and enter the access code given to you at your visit today. This web site gives a general overview about your procedure. However, you should still follow specific instructions given to you by our office regarding your preparation for the procedure.

## 2016-05-09 ENCOUNTER — Encounter: Payer: Self-pay | Admitting: Gastroenterology

## 2016-05-09 DIAGNOSIS — R1013 Epigastric pain: Secondary | ICD-10-CM | POA: Insufficient documentation

## 2016-05-09 DIAGNOSIS — K5900 Constipation, unspecified: Secondary | ICD-10-CM | POA: Insufficient documentation

## 2016-05-09 DIAGNOSIS — R110 Nausea: Secondary | ICD-10-CM | POA: Insufficient documentation

## 2016-05-09 DIAGNOSIS — Z791 Long term (current) use of non-steroidal anti-inflammatories (NSAID): Secondary | ICD-10-CM | POA: Insufficient documentation

## 2016-05-09 NOTE — Progress Notes (Signed)
05/09/2016 Shelby Fitzpatrick [id number] 1966/02/03   HISTORY OF PRESENT ILLNESS:  This is a 51 year old female who is new to our practice.  Referred here by Shelby Aquas, PA-C, for evaluation of abdominal pain and nausea.  She report epigastric abdominal and nausea for about the past 1.5 months.  Has had a couple of episodes of vomiting as well.  Describes pain as burning.  Some discomfort into her left upper back as well.  Says that nausea sometimes wakes her from sleep in the middle of the night.  Reports taking Excedrin 2 pills daily, every day for several years to manage chronic headaches.  Denies black stools.  Recent CBC, CMP, lipase, Hpylori Ab normal/negative.  Tried pantoprazole for a short time, about 2 weeks, and stopped taking it because she did not notice any improvement in her symptoms.  Is now on Nexium 40 mg daily for about the past 3 weeks or so.  Also reports constipation that has been an issues all her life.  Says that she just deals with it.  Says that sometime she only has a BM once or twice a month.  Does not really use anything to help her go.  Denies seeing blood in her stool.  Says that she had a small BM this AM.  TSH normal.  No rectal bleeding.  Past Medical History:  Diagnosis Date  . CAD (coronary artery disease)    coronary intervention and drug-eluting stent placement STEMI-2011   Cath 02/25/2010:  100% RCA (PCI with Promus DES); minor LAD and CFX (nothing more than 20%)  EF 65% at cath 02/25/2010  . Ectopic pregnancy    (ruptured)  . Hyperlipemia   . Hypertension   . Myocardial infarction    Past Surgical History:  Procedure Laterality Date  . ANKLE SURGERY     broken and had surgery to reapair  . APPENDECTOMY     at age 38  . CARDIAC CATHETERIZATION N/A 05/29/2015   Procedure: Left Heart Cath and Coronary Angiography;  Surgeon: Jettie Booze, MD;  Location: [facility];  Service: Cardiovascular;  Laterality: N/A;  . coronary intervention      drug-eluting stent placement-2011  . tubal pregnancy x2     cant recall year    reports that she has been smoking Cigarettes.  She has a 17.50 pack-year smoking history. She has never used smokeless tobacco. She reports that she drinks alcohol. She reports that she does not use drugs. family history includes Colon polyps in her sister and sister; Coronary artery disease in her father; Heart attack in her father; Hypertension in her father, mother, and sister; Stroke in her father. Allergies  Allergen Reactions  . Penicillins       Outpatient Encounter Prescriptions as of 05/07/2016  Medication Sig  . aspirin 81 MG tablet Take 1 tablet (81 mg total) by mouth daily.  [name] Kitchen atorvastatin (LIPITOR) 20 MG tablet TAKE 1 TABLET BY MOUTH DAILY  . cyclobenzaprine (FLEXERIL) 10 MG tablet TK 1 T PO QHS PRF MUSCLE SPASMSn in lower back  . esomeprazole (NEXIUM) 40 MG capsule Take 1 capsule (40 mg total) by mouth daily at 12 noon.  . metoprolol tartrate (LOPRESSOR) 25 MG tablet TAKE 1 TABLET BY MOUTH TWICE DAILY  . nitroGLYCERIN (NITROSTAT) 0.4 MG SL tablet PLACE 1 TABLET UNDER THE TONGUE EVERY 5 MINUTES 3 TIMES AS NEEDED AS DIRECTED   No facility-administered encounter medications on file as of 05/07/2016.  REVIEW OF SYSTEMS  : All other systems reviewed and negative except where noted in the History of Present Illness.   PHYSICAL EXAM: BP 118/84   Pulse 72   Ht 5\' 7"  (1.702 m)   Wt 158 lb 8 oz (71.9 kg)   LMP 02/24/2013   BMI 24.82 kg/m  General: Well developed white female in no acute distress Head: Normocephalic and atraumatic Eyes:  Sclerae anicteric, conjunctiva pink. Ears: Normal auditory acuity Lungs: Clear throughout to auscultation.  No increased WOB. Heart: Regular rate and rhythm Abdomen: Soft, non-distended.  Normal bowel sounds.  Epigastric TTP. Musculoskeletal: Symmetrical with no gross deformities  Skin: No lesions on visible extremities Extremities: No edema    Neurological: Alert oriented x 4, grossly non-focal Psychological:  Alert and cooperative. Normal mood and affect  ASSESSMENT AND PLAN: -Epigastric pain and nausea in the setting of long-term NSAID use:  Rule out ulcer disease, reflux, etc.  I have asked her to try to discontinue her Excedrin if possible and discuss with her PCP alternate ways to manage her headaches.  Continue Nexium 40 mg daily for now.  Will schedule for EGD with Dr. Havery Moros.  The risks, benefits, and alternatives to EGD were discussed with the patient and she consents to proceed.  -Constipation:  Lifelong.  Will try Miralax once to twice daily.   CC:  Shelby Jeans, PA-C

## 2016-05-12 NOTE — Progress Notes (Signed)
Agree with assessment and plan as outlined.  

## 2016-05-16 ENCOUNTER — Other Ambulatory Visit: Payer: Self-pay | Admitting: Cardiovascular Disease

## 2016-05-30 ENCOUNTER — Encounter: Payer: Self-pay | Admitting: Gastroenterology

## 2016-06-01 NOTE — Progress Notes (Signed)
GYN ONCOLOGY OFFICE VISIT  Shelby Fitzpatrick 51 y.o. female  CC:  Simple left adnexal cyst  Assessment/Plan:  Shelby Fitzpatrick is a 51 y.o.  with symptoms consistent with continued ovulatory events and serial ultrasounds the most recent of which notes the presence of a 2.2 cm left anterior complex ovarian cyst measuring 1.5 cm. Calcifications are seen along the wall and within the ovary. No free fluid is visualized and CA-125 is within normal limits.  Missed [place] history is also notable for significant coronary artery disease and prior myocardial infarction. Repeat UTX with simple left adnexal cyst.  CA125 9.  Follow-up with Dr. Stann Mainland in 12/2016.  Recommend repeat UTZ  Intervention as warranted by pain     HPI: Shelby Fitzpatrick is a 51 y.o.  with a history of endometrial ablation who noted vaginal spotting in April and May 2017.  Pelvic ultrasounds  08/24/15: Uterus is midline.  No uterine masses are seen.  There is a 1.8 cm nabothian cyst.  Endometrium is mostly obscured.  Patient is s/p ablation.  Fundally the lining measures 6.9 mm.  Right ovary has calcifications.  Left ovary has three masses.  Two are solid appearing with soft internal echos.  One with fluid level measures 2.6 cm.  Simple appearing cyst measures 2.3 cm and solid without fluid line measures 2.5 cm.  No increase blood flow.  Scattered calcifications along the borders of all masses.  No free fluid. 09/11/15: Uterus is anteverted.  Lt LAT fibroid noted.  EM measures 0.79 cm.  RO contains small calcifications.  LO has 3 cysts: 1= 1.8 cm (mixed), 2=1.5 cm (mixed), 3= 1.5 cm (simple).  Have all decreased in size from previous scan.  No FF is seen.  Nabothian cysts are seen in CX.  No latex allergy  12/04/15: Anteverted uterus with a 1.9 cm left lateral fibroid.  Endometrium is hard to define.  Patient is s/p ablation.  Right ovary has calcifications throughout and a cystic structure measuring 1.8 cm with calcifications in  the wall.  No increase in Doppler flow.  Left ovary appears multicystic with cysts measuring 2.2 cm, 1.5 cm, 1.8 cm, and 1.4 cm.  Inferior complex cyst measures 2.2 cm and anterior complex cyst measures 1.5 cm.  Calcifications are seen along the wall and within the ovary.  No free fluid is visualized.  CA 125 12/05/2015   Pelvic ultrasound 04/01/2016 Right ovary Measurements: 3.3 x 1.8 x 2.3 cm. Cystic lesion is noted within the right ovary measuring 1.8 cm. This is simple in appearance and stable from the prior exam. The echogenic focus within the right ovary seen on the prior exam is less well appreciated on the current study.  Left ovary Measurements: 3.3 x 2.1 x 2.3 cm. The simple cystic lesion seen previously is again identified and measures 1.9 x 1.6 x 1.7 cm. It demonstrates a a area of increased echogenicity within which may represent a hemorrhagic cyst.  The smaller lesion seen on the prior ultrasound examination is less well appreciated. Reports discomfort with ovulation     Review of Systems: Constitutional  Feels well,  Cardiovascular  No chest pain, shortness of breath, or edema  Pulmonary  No cough or wheeze.  Gastro Intestinal  No nausea, vomitting, or diarrhoea. No bright red blood per rectum, no abdominal pain, change in bowel movement, or constipation,  rare episodes of abdominal bloating, denies early satiety and reports excellent appetite Genito Urinary  No frequency, urgency, dysuria, no vaginal  bleeding or discharge. States that she can still identify when she is ovulating and its associated with significant discomfort. Musculo Skeletal  No myalgia, arthralgia, joint swelling or pain  Neurologic  No weakness, numbness, change in gait,  Psychology  No depression, anxiety, insomnia.    Social Hx:   Social History   Social History  . Marital status: Married    Spouse name: N/A  . Number of children: 1  . Years of education: N/A   Occupational History  .  loan funder    Social History Main Topics  . Smoking status: Current Every Day Smoker    Packs/day: 0.50    Years: 35.00    Types: Cigarettes  . Smokeless tobacco: Never Used  . Alcohol use 0.0 oz/week     Comment: rare  . Drug use: No  . Sexual activity: Not on file   Other Topics Concern  . Not on file   Social History Narrative  . No narrative on file    Past Surgical Hx:  Past Surgical History:  Procedure Laterality Date  . ANKLE SURGERY     broken and had surgery to reapair  . APPENDECTOMY     at age 65  . CARDIAC CATHETERIZATION N/A 05/29/2015   Procedure: Left Heart Cath and Coronary Angiography;  Surgeon: Jettie Booze, MD;  Location: [facility];  Service: Cardiovascular;  Laterality: N/A;  . coronary intervention      drug-eluting stent placement-2011  . tubal pregnancy x2     cant recall year    Past Medical Hx:  Past Medical History:  Diagnosis Date  . CAD (coronary artery disease)    coronary intervention and drug-eluting stent placement STEMI-2011   Cath 02/25/2010:  100% RCA (PCI with Promus DES); minor LAD and CFX (nothing more than 20%)  EF 65% at cath 02/25/2010  . Ectopic pregnancy    (ruptured)  . Hyperlipemia   . Hypertension   . Myocardial infarction     Past Gynecological History:Gravida 7 para 1 .  Laparoscopic salpingostomies on the left 05/2005 and a right in June 2001 for ectopic pregnancies  Patient's last menstrual period was 02/24/2013. In May 2015  Pap 12/04/2015 Diagnosis NEGATIVE FOR INTRAEPITHELIAL LESIONS OR MALIGNANCY. CELLULAR CHANGES CONSISTENT WITH HYPERKERATOSIS.  Interval history:  Family Hx:  Family History  Problem Relation Age of Onset  . Coronary artery disease Father   . Heart attack Father   . Hypertension Father   . Stroke Father   . Hypertension Mother   . Hypertension Sister   . Colon polyps Sister   . Colon polyps Sister   . Colon cancer Neg Hx   . Stomach cancer Neg Hx   . Rectal cancer  Neg Hx   . Esophageal cancer Neg Hx   . Liver cancer Neg Hx     Vitals:  Last menstrual period 02/24/2013.  Physical Exam: WD in NAD Psychiatry  Alert and oriented appropriate mood affect speech and reasoning. Abdomen  Normoactive bowel sounds, abdomen soft, non-tender.  Back

## 2016-06-02 ENCOUNTER — Ambulatory Visit: Payer: Managed Care, Other (non HMO) | Attending: Gynecologic Oncology | Admitting: Gynecologic Oncology

## 2016-06-02 ENCOUNTER — Encounter: Payer: Self-pay | Admitting: Gynecologic Oncology

## 2016-06-02 VITALS — BP 145/83 | HR 79 | Temp 98.4°F | Resp 16 | Ht 67.0 in | Wt 155.4 lb

## 2016-06-02 DIAGNOSIS — Z8371 Family history of colonic polyps: Secondary | ICD-10-CM | POA: Diagnosis not present

## 2016-06-02 DIAGNOSIS — I2510 Atherosclerotic heart disease of native coronary artery without angina pectoris: Secondary | ICD-10-CM | POA: Insufficient documentation

## 2016-06-02 DIAGNOSIS — Z955 Presence of coronary angioplasty implant and graft: Secondary | ICD-10-CM | POA: Insufficient documentation

## 2016-06-02 DIAGNOSIS — F17210 Nicotine dependence, cigarettes, uncomplicated: Secondary | ICD-10-CM | POA: Insufficient documentation

## 2016-06-02 DIAGNOSIS — N854 Malposition of uterus: Secondary | ICD-10-CM | POA: Insufficient documentation

## 2016-06-02 DIAGNOSIS — N83292 Other ovarian cyst, left side: Secondary | ICD-10-CM

## 2016-06-02 DIAGNOSIS — N83202 Unspecified ovarian cyst, left side: Secondary | ICD-10-CM | POA: Diagnosis present

## 2016-06-02 DIAGNOSIS — N83201 Unspecified ovarian cyst, right side: Secondary | ICD-10-CM | POA: Insufficient documentation

## 2016-06-02 DIAGNOSIS — I10 Essential (primary) hypertension: Secondary | ICD-10-CM | POA: Diagnosis not present

## 2016-06-02 DIAGNOSIS — N83299 Other ovarian cyst, unspecified side: Secondary | ICD-10-CM

## 2016-06-02 DIAGNOSIS — Z823 Family history of stroke: Secondary | ICD-10-CM | POA: Insufficient documentation

## 2016-06-02 DIAGNOSIS — Z8249 Family history of ischemic heart disease and other diseases of the circulatory system: Secondary | ICD-10-CM | POA: Insufficient documentation

## 2016-06-02 DIAGNOSIS — E785 Hyperlipidemia, unspecified: Secondary | ICD-10-CM | POA: Insufficient documentation

## 2016-06-02 DIAGNOSIS — I252 Old myocardial infarction: Secondary | ICD-10-CM | POA: Diagnosis not present

## 2016-06-02 NOTE — Patient Instructions (Signed)
No additional follow ups with Gyn Oncology. Please call our office with any concerns.

## 2016-06-08 ENCOUNTER — Other Ambulatory Visit: Payer: Self-pay | Admitting: Cardiovascular Disease

## 2016-06-08 ENCOUNTER — Other Ambulatory Visit: Payer: Self-pay | Admitting: Physician Assistant

## 2016-06-08 DIAGNOSIS — R1013 Epigastric pain: Secondary | ICD-10-CM

## 2016-06-09 ENCOUNTER — Ambulatory Visit (AMBULATORY_SURGERY_CENTER): Payer: Managed Care, Other (non HMO) | Admitting: Gastroenterology

## 2016-06-09 VITALS — BP 131/81 | HR 61 | Temp 97.8°F | Resp 12 | Ht 67.0 in | Wt 158.0 lb

## 2016-06-09 DIAGNOSIS — K2970 Gastritis, unspecified, without bleeding: Secondary | ICD-10-CM | POA: Diagnosis not present

## 2016-06-09 DIAGNOSIS — R1013 Epigastric pain: Secondary | ICD-10-CM | POA: Diagnosis not present

## 2016-06-09 MED ORDER — ESOMEPRAZOLE MAGNESIUM 40 MG PO CPDR: 40.0000 mg | DELAYED_RELEASE_CAPSULE | Freq: Two times a day (BID) | ORAL | 3 refills | Status: DC

## 2016-06-09 MED ORDER — SODIUM CHLORIDE 0.9 % IV SOLN: 500.0000 mL | INTRAVENOUS | Status: DC

## 2016-06-09 NOTE — Op Note (Signed)
[facility] Patient Name: Shelby Fitzpatrick Procedure Date: 06/09/2016 10:00 AM MRN: [id number] Endoscopist: Remo Lipps P. Amando Ishikawa MD, MD Age: 51 Referring MD:  Date of Birth: [birth year] Gender: Female Account #: [id number] Procedure:                Upper GI endoscopy Indications:              Epigastric abdominal pain, nausea, history of                            Excedrin use Medicines:                Monitored Anesthesia Care Procedure:                Pre-Anesthesia Assessment:                           - Prior to the procedure, a History and Physical                            was performed, and patient medications and                            allergies were reviewed. The patient's tolerance of                            previous anesthesia was also reviewed. The risks                            and benefits of the procedure and the sedation                            options and risks were discussed with the patient.                            All questions were answered, and informed consent                            was obtained. Prior Anticoagulants: The patient has                            taken no previous anticoagulant or antiplatelet                            agents. ASA Grade Assessment: III - A patient with                            severe systemic disease. After reviewing the risks                            and benefits, the patient was deemed in                            satisfactory condition to undergo the procedure.  After obtaining informed consent, the endoscope was                            passed under direct vision. Throughout the                            procedure, the patient's blood pressure, pulse, and                            oxygen saturations were monitored continuously. The                            Model GIF-HQ190 [id number]) scope was introduced                            through the mouth, and advanced  to the second part                            of duodenum. The upper GI endoscopy was                            accomplished without difficulty. The patient                            tolerated the procedure well. Scope In: Scope Out: Findings:                 Esophagogastric landmarks were identified: the                            Z-line was found at 34 cm, the gastroesophageal                            junction was found at 34 cm and the upper extent of                            the gastric folds was found at 34 cm from the                            incisors.                           The exam of the esophagus was otherwise normal.                           One non-bleeding cratered clean based gastric ulcer                            with no stigmata of bleeding was found at the                            pylorus. The lesion was 7 mm in largest dimension.  Diffuse moderate inflammation characterized by                            erythema, friability and granularity was found in                            the gastric body and in the gastric antrum.                            Biopsies were taken with a cold forceps for                            Helicobacter pylori testing.                           The exam of the stomach was otherwise normal.                           The duodenal bulb and second portion of the                            duodenum were normal. Complications:            No immediate complications. Estimated blood loss:                            Minimal. Estimated Blood Loss:     Estimated blood loss was minimal. Impression:               - Esophagogastric landmarks identified.                           - Normal esophagus                           - Non-bleeding clean based gastric ulcer with no                            stigmata of bleeding.                           - Gastritis. Biopsied.                           - Normal duodenal  bulb and second portion of the                            duodenum. Recommendation:           - Patient has a contact number available for                            emergencies. The signs and symptoms of potential                            delayed complications were discussed with the  patient. Return to normal activities tomorrow.                            Written discharge instructions were provided to the                            patient.                           - Resume previous diet.                           - Continue present medications.                           - Increase nexium to 40mg  twice daily                           - No ibuprofen, naproxen, or other non-steroidal                            anti-inflammatory drugs.                           - Await pathology results.                           - Repeat upper endoscopy in 2-3 months to evaluate                            the response to therapy. Remo Lipps P. Brayson Livesey MD, MD 06/09/2016 10:15:26 AM This report has been signed electronically.

## 2016-06-09 NOTE — Progress Notes (Signed)
A/ox3, pleased with MAC, report to RN 

## 2016-06-09 NOTE — Patient Instructions (Signed)
Impression/recommendations:  Ulcer (handout given) Gastritis (handout given)  No ibuprofen, naproxen, or other non-steroidal anti-inflammatory drugs.  Increase Nexium to 40 mg twice a day.  Repeat endoscopy in 2- 3 months.  YOU HAD AN ENDOSCOPIC PROCEDURE TODAY AT [facility]:   Refer to the procedure report that was given to you for any specific questions about what was found during the examination.  If the procedure report does not answer your questions, please call your gastroenterologist to clarify.  If you requested that your care partner not be given the details of your procedure findings, then the procedure report has been included in a sealed envelope for you to review at your convenience later.  YOU SHOULD EXPECT: Some feelings of bloating in the abdomen. Passage of more gas than usual.  Walking can help get rid of the air that was put into your GI tract during the procedure and reduce the bloating. If you had a lower endoscopy (such as a colonoscopy or flexible sigmoidoscopy) you may notice spotting of blood in your stool or on the toilet paper. If you underwent a bowel prep for your procedure, you may not have a normal bowel movement for a few days.  Please Note:  You might notice some irritation and congestion in your nose or some drainage.  This is from the oxygen used during your procedure.  There is no need for concern and it should clear up in a day or so.  SYMPTOMS TO REPORT IMMEDIATELY:   Following upper endoscopy (EGD)  Vomiting of blood or coffee ground material  New chest pain or pain under the shoulder blades  Painful or persistently difficult swallowing  New shortness of breath  Fever of 100F or higher  Black, tarry-looking stools  For urgent or emergent issues, a gastroenterologist can be reached at any hour by calling [phone].   DIET:  We do recommend a small meal at first, but then you may proceed to your regular diet.  Drink plenty of  fluids but you should avoid alcoholic beverages for 24 hours.  ACTIVITY:  You should plan to take it easy for the rest of today and you should NOT DRIVE or use heavy machinery until tomorrow (because of the sedation medicines used during the test).    FOLLOW UP: Our staff will call the number listed on your records the next business day following your procedure to check on you and address any questions or concerns that you may have regarding the information given to you following your procedure. If we do not reach you, we will leave a message.  However, if you are feeling well and you are not experiencing any problems, there is no need to return our call.  We will assume that you have returned to your regular daily activities without incident.  If any biopsies were taken you will be contacted by phone or by letter within the next 1-3 weeks.  Please call us at [phone] if you have not heard about the biopsies in 3 weeks.    SIGNATURES/CONFIDENTIALITY: You and/or your care partner have signed paperwork which will be entered into your electronic medical record.  These signatures attest to the fact that that the information above on your After Visit Summary has been reviewed and is understood.  Full responsibility of the confidentiality of this discharge information lies with you and/or your care-partner.

## 2016-06-09 NOTE — Progress Notes (Signed)
Pt's states no medical or surgical changes since previsit or office visit. 

## 2016-06-09 NOTE — Progress Notes (Signed)
Called to room to assist during endoscopic procedure.  Patient ID and intended procedure confirmed with present staff. Received instructions for my participation in the procedure from the performing physician.  

## 2016-06-10 ENCOUNTER — Telehealth: Payer: Self-pay

## 2016-06-10 NOTE — Telephone Encounter (Signed)
  Follow up Call-  Call back number 06/09/2016  Post procedure Call Back phone  # [phone]  Permission to leave phone message Yes  Some recent data might be hidden     Patient questions:  Do you have a fever, pain , or abdominal swelling? No. Pain Score  0 *  Have you tolerated food without any problems? Yes.    Have you been able to return to your normal activities? Yes.    Do you have any questions about your discharge instructions: Diet   No. Medications  No. Follow up visit  No.  Do you have questions or concerns about your Care? No.  Actions: * If pain score is 4 or above: No action needed, pain <4.

## 2016-06-12 ENCOUNTER — Telehealth: Payer: Self-pay | Admitting: Physician Assistant

## 2016-06-12 NOTE — Telephone Encounter (Signed)
**  Remind patient they can make refill requests via MyChart**  Medication refill request (Name & Dosage): Esomeprazole - 40 mg    Preferred pharmacy (Name & Address): [facility]     Other comments (if applicable): no more refills lefts

## 2016-06-13 ENCOUNTER — Encounter: Payer: Self-pay | Admitting: Emergency Medicine

## 2016-06-13 NOTE — Telephone Encounter (Signed)
After verifying patient chart. Dr. Raynelle Highland has refilled the Nexium to the [facility] for patient Sent a My chart message to advise patient.

## 2016-06-17 ENCOUNTER — Telehealth: Payer: Self-pay | Admitting: Gastroenterology

## 2016-06-17 MED ORDER — NYSTATIN 100000 UNIT/ML MT SUSP
5.0000 mL | Freq: Four times a day (QID) | OROMUCOSAL | 0 refills | Status: DC
Start: 2016-06-17 — End: 2016-07-04

## 2016-06-17 NOTE — Telephone Encounter (Signed)
Sent appeal to [employer]

## 2016-06-19 ENCOUNTER — Other Ambulatory Visit: Payer: Self-pay

## 2016-06-19 ENCOUNTER — Telehealth: Payer: Self-pay | Admitting: Gastroenterology

## 2016-06-19 MED ORDER — BIS SUBCIT-METRONID-TETRACYC 140-125-125 MG PO CAPS: 3.0000 | ORAL_CAPSULE | Freq: Three times a day (TID) | ORAL | 0 refills | Status: DC

## 2016-06-24 ENCOUNTER — Encounter: Payer: Self-pay | Admitting: General Practice

## 2016-06-25 ENCOUNTER — Telehealth: Payer: Self-pay | Admitting: Gastroenterology

## 2016-06-25 NOTE — Telephone Encounter (Signed)
Left message informing pt that Nexium has been denied. An appeal was sent along with OV notes and the medication is still being denied. Instructed pt to return call to let me know if she is going to purchase the medication over the counter or does she want Dr Havery Moros tp change to a different medication.

## 2016-06-25 NOTE — Telephone Encounter (Signed)
See previous note

## 2016-06-26 ENCOUNTER — Other Ambulatory Visit: Payer: Self-pay

## 2016-06-26 ENCOUNTER — Telehealth: Payer: Self-pay | Admitting: Gastroenterology

## 2016-06-26 DIAGNOSIS — R1013 Epigastric pain: Secondary | ICD-10-CM

## 2016-06-26 MED ORDER — ESOMEPRAZOLE MAGNESIUM 40 MG PO CPDR: 40.0000 mg | DELAYED_RELEASE_CAPSULE | Freq: Two times a day (BID) | ORAL | 2 refills | Status: DC

## 2016-06-26 NOTE — Telephone Encounter (Signed)
Shelby Fitzpatrick I received notification in the mail that this patient's nexium 40mg  BID was denied by her insurance. She has a gastric ulcer and is positive for H pylori (which is listed as a condition for which this should be approved). At this time, we either appeal the decision for nexium 40mg  BID, it should be coded for H pylori and a gastric ulcer. If this is not approved or taking too long to process, we can switch her to omeprazole 40mg  twice daily if this is easier to obtain. Thanks  Can you let me know?

## 2016-06-26 NOTE — Telephone Encounter (Signed)
Spoke with pt yesterday afternoon. She was made aware that [facility] is denying Nexium due to quantity that was prescribed. I spoke with Olivia Mackie at [facility] and she is going to fax info in reguards to this. In the meantime, I sent a new Rx to the pharmacy for #60 instead of #180 in hopes of insurance covering this. Pt informed of this and agreed with plan.

## 2016-06-26 NOTE — Telephone Encounter (Signed)
Looks like Caryl Pina is working on this, there are notes in the chart. She spoke with someone at [facility], I believe that it was a quantity issue. Thanks.

## 2016-06-27 NOTE — Telephone Encounter (Signed)
Thanks Valentina Gu, Juluis Rainier, please see below. Any updates on this? My recommendations are as below

## 2016-06-30 NOTE — Telephone Encounter (Signed)
[employer] requests that pt also be (+) H.Pylori and pt is. Sent results to [facility] today. Will await PA response for Nexium.

## 2016-07-01 NOTE — Telephone Encounter (Signed)
Received approval for Esomeprazole today. Faxed approval to pts pharmacy ([facility]). Pt informed.

## 2016-07-03 ENCOUNTER — Other Ambulatory Visit: Payer: Self-pay | Admitting: Cardiovascular Disease

## 2016-07-04 ENCOUNTER — Encounter: Payer: Self-pay | Admitting: Physician Assistant

## 2016-07-04 ENCOUNTER — Ambulatory Visit (INDEPENDENT_AMBULATORY_CARE_PROVIDER_SITE_OTHER): Payer: Managed Care, Other (non HMO) | Admitting: Physician Assistant

## 2016-07-04 VITALS — BP 130/80 | HR 80 | Temp 98.5°F | Resp 14 | Ht 67.0 in | Wt 159.0 lb

## 2016-07-04 DIAGNOSIS — B370 Candidal stomatitis: Secondary | ICD-10-CM

## 2016-07-04 DIAGNOSIS — H6982 Other specified disorders of Eustachian tube, left ear: Secondary | ICD-10-CM

## 2016-07-04 MED ORDER — NYSTATIN 100000 UNIT/ML MT SUSP
5.0000 mL | Freq: Four times a day (QID) | OROMUCOSAL | 0 refills | Status: DC
Start: 2016-07-04 — End: 2016-07-25

## 2016-07-04 NOTE — Patient Instructions (Addendum)
Restart the Nystatin over the weekend. Use as directed. Contineu good dental hygiene.  Start a daily Digestive Advantage probiotic.  Now that antibiotics are stopped, should hopefully not have a recurrence of symptoms. Stop Nystatin once resolved. Do a Peroxyl mouthwash (made by [employer]) once daily after brushing and flossing in the morning.  If there is any further recurrence, we will need input from Gastroenterology.   Continue Flonase nightly.  Start a saline nasal spray twice daily. Pinch the nose and try to blow out the nose to help pop open your Eustachian tubes.  Let me know if symptoms are not resolving.

## 2016-07-04 NOTE — Progress Notes (Signed)
Pre visit review using our clinic review tool, if applicable. No additional management support is needed unless otherwise documented below in the visit note. 

## 2016-07-04 NOTE — Progress Notes (Signed)
Patient presents to clinic today to discuss potential recurrence of thrush after treatment with Nystatin. Patient endorses using medication as directed with complete resolution of symptoms. Has since been on a regimen for h. Pylori infection and has noted white patches on tongue over the past day or so. No known history of immunocompromise. Denies fever, chills, malaise or fatigue. Denies sore throat or pain of tongue.   Patient also noted some pressure and popping of L ear without pain, tinnitus or drainage from ear. Denies other URI symptoms.  Past Medical History:  Diagnosis Date  . CAD (coronary artery disease)    coronary intervention and drug-eluting stent placement STEMI-2011   Cath 02/25/2010:  100% RCA (PCI with Promus DES); minor LAD and CFX (nothing more than 20%)  EF 65% at cath 02/25/2010  . Ectopic pregnancy    (ruptured)  . Hyperlipemia   . Hypertension   . Myocardial infarction     Current Outpatient Prescriptions on File Prior to Visit  Medication Sig Dispense Refill  . aspirin 81 MG tablet Take 1 tablet (81 mg total) by mouth daily.    [name] Kitchen atorvastatin (LIPITOR) 20 MG tablet TAKE 1 TABLET BY MOUTH DAILY 30 tablet 1  . esomeprazole (NEXIUM) 40 MG capsule Take 1 capsule (40 mg total) by mouth 2 (two) times daily. 60 capsule 2  . metoprolol tartrate (LOPRESSOR) 25 MG tablet Take 1 tablet (25 mg total) by mouth 2 (two) times daily. 60 tablet 1  . nitroGLYCERIN (NITROSTAT) 0.4 MG SL tablet PLACE 1 TABLET UNDER THE TONGUE EVERY 5 MINUTES 3 TIMES AS NEEDED AS DIRECTED 25 tablet 4  . cyclobenzaprine (FLEXERIL) 10 MG tablet TK 1 T PO QHS PRF MUSCLE SPASMSn in lower back  0   Current Facility-Administered Medications on File Prior to Visit  Medication Dose Route Frequency Provider Last Rate Last Dose  . 0.9 %  sodium chloride infusion  500 mL Intravenous Continuous Manus Gunning, MD        Allergies  Allergen Reactions  . Penicillins     Family History  Problem  Relation Age of Onset  . Coronary artery disease Father   . Heart attack Father   . Hypertension Father   . Stroke Father   . Hypertension Mother   . Hypertension Sister   . Colon polyps Sister   . Colon polyps Sister   . Colon cancer Neg Hx   . Stomach cancer Neg Hx   . Rectal cancer Neg Hx   . Esophageal cancer Neg Hx   . Liver cancer Neg Hx     Social History   Social History  . Marital status: Married    Spouse name: N/A  . Number of children: 1  . Years of education: N/A   Occupational History  . loan funder    Social History Main Topics  . Smoking status: Current Every Day Smoker    Packs/day: 0.50    Years: 35.00    Types: Cigarettes  . Smokeless tobacco: Never Used  . Alcohol use 0.0 oz/week     Comment: rare  . Drug use: No  . Sexual activity: Not Asked   Other Topics Concern  . None   Social History Narrative  . None   Review of Systems - See HPI.  All other ROS are negative.  BP 130/80   Pulse 80   Temp 98.5 F (36.9 C) (Oral)   Resp 14   Ht '5\' 7"'  (1.702 m)  Wt 159 lb (72.1 kg)   LMP 02/24/2013   SpO2 98%   BMI 24.90 kg/m   Physical Exam  Constitutional: She is well-developed, well-nourished, and in no distress.  HENT:  Head: Normocephalic and atraumatic.  Right Ear: External ear normal.  Left Ear: External ear normal. A middle ear effusion is present.  Mouth/Throat:    Eyes: Conjunctivae are normal.  Neck: Neck supple.  Cardiovascular: Normal rate, regular rhythm, normal heart sounds and intact distal pulses.   Pulmonary/Chest: Effort normal and breath sounds normal. No respiratory distress. She has no wheezes. She has no rales. She exhibits no tenderness.  Lymphadenopathy:    She has no cervical adenopathy.  Vitals reviewed.   Recent Results (from the past 2160 hour(s))  H. pylori antibody, IgG     Status: None   Collection Time: 04/15/16  9:42 AM  Result Value Ref Range   H Pylori IgG Negative Negative  Lipase     Status:  None   Collection Time: 04/15/16  9:42 AM  Result Value Ref Range   Lipase 16.0 11.0 - 59.0 U/L  Comp Met (CMET)     Status: Abnormal   Collection Time: 04/15/16  9:42 AM  Result Value Ref Range   Sodium 140 135 - 145 mEq/L   Potassium 4.4 3.5 - 5.1 mEq/L   Chloride 103 96 - 112 mEq/L   CO2 29 19 - 32 mEq/L   Glucose, Bld 90 70 - 99 mg/dL   BUN 10 6 - 23 mg/dL   Creatinine, Ser 0.79 0.40 - 1.20 mg/dL   Total Bilirubin 0.3 0.2 - 1.2 mg/dL   Alkaline Phosphatase 82 39 - 117 U/L   AST 17 0 - 37 U/L   ALT 12 0 - 35 U/L   Total Protein 7.3 6.0 - 8.3 g/dL   Albumin 4.5 3.5 - 5.2 g/dL   Calcium 10.9 (H) 8.4 - 10.5 mg/dL   GFR 82.27 >60.00 mL/min  TSH     Status: None   Collection Time: 04/15/16  9:42 AM  Result Value Ref Range   TSH 2.75 0.35 - 4.50 uIU/mL    Assessment/Plan: 1. Thrush Likely 2/2 multiple ABX for H. Pylori eradication. Restart Nystatin as directed. OTC Peroxyl mouthwash as needed to help healing. Start daily probiotic. Patient has FU scheduled with GI. If any further recurrence will need a further assessment.   2. Eustachian tube dysfunction, left Flonase as directed. Supportive measures reviewed. FU if symptoms are not resolving.    Leeanne Rio, PA-C

## 2016-07-21 ENCOUNTER — Telehealth: Payer: Self-pay

## 2016-07-21 ENCOUNTER — Ambulatory Visit (AMBULATORY_SURGERY_CENTER): Payer: Self-pay

## 2016-07-21 VITALS — Ht 67.0 in | Wt 159.0 lb

## 2016-07-21 DIAGNOSIS — K253 Acute gastric ulcer without hemorrhage or perforation: Secondary | ICD-10-CM

## 2016-07-21 NOTE — Telephone Encounter (Signed)
Dr Havery Moros,      Pt wanted to be sure and mention her unresolved oral thrush.  She was advised to have you look at it please. Thank you, Angela/PV

## 2016-07-21 NOTE — Telephone Encounter (Signed)
If she has symptoms for thrush we can treat her with nystatin swishand swallow -  500,000 units 4 times/day; swish in the mouth and retain for as long as possible (several minutes) before swallowing, for one week. Can you help order this for her and let her know? Thanks

## 2016-07-22 ENCOUNTER — Encounter: Payer: Self-pay | Admitting: Gastroenterology

## 2016-07-23 NOTE — Telephone Encounter (Signed)
Okay that's fine. If present on endoscopy then we can consider a course of fluconazole. Thanks

## 2016-07-23 NOTE — Telephone Encounter (Signed)
I spoke with Jocelyn Lamer to report Dr. Doyne Keel answer to the note regarding Katiana's thrush. Patient reports that she has ben treated twice with Nystatin and she still has it. Patient declined another Prescription for Nystatin because it hasn't improved.  Itzy states that her PCP treated her twice and if it isn't gone after two treatments than he was referring her to Dr. Havery Moros. Mirelle states that it doesn't hurt and she will wait for Dr. Havery Moros to see it when he does her endoscopy on 08/04/16.   Riki Sheer, LPN

## 2016-07-23 NOTE — Telephone Encounter (Signed)
Thank you Dr Havery Moros,      I was off yesterday.  I will notify pt today. Angela/PV

## 2016-07-25 ENCOUNTER — Ambulatory Visit (INDEPENDENT_AMBULATORY_CARE_PROVIDER_SITE_OTHER): Payer: 59 | Admitting: Cardiovascular Disease

## 2016-07-25 ENCOUNTER — Encounter: Payer: Self-pay | Admitting: Cardiovascular Disease

## 2016-07-25 VITALS — BP 130/80 | HR 72 | Ht 67.0 in | Wt 160.4 lb

## 2016-07-25 DIAGNOSIS — I25810 Atherosclerosis of coronary artery bypass graft(s) without angina pectoris: Secondary | ICD-10-CM | POA: Diagnosis not present

## 2016-07-25 DIAGNOSIS — I259 Chronic ischemic heart disease, unspecified: Secondary | ICD-10-CM

## 2016-07-25 DIAGNOSIS — E782 Mixed hyperlipidemia: Secondary | ICD-10-CM

## 2016-07-25 MED ORDER — METOPROLOL TARTRATE 25 MG PO TABS: 25.0000 mg | ORAL_TABLET | Freq: Two times a day (BID) | ORAL | 11 refills | Status: DC

## 2016-07-25 MED ORDER — NITROGLYCERIN 0.4 MG SL SUBL: SUBLINGUAL_TABLET | SUBLINGUAL | 4 refills | Status: DC

## 2016-07-25 MED ORDER — ATORVASTATIN CALCIUM 20 MG PO TABS: 20.0000 mg | ORAL_TABLET | Freq: Every day | ORAL | 3 refills | Status: DC

## 2016-07-25 NOTE — Patient Instructions (Signed)
Your physician recommends that you continue on your current medications as directed. Please refer to the Current Medication list given to you today.  Your physician wants you to follow-up in: 1 year with Dr. Cooper.  You will receive a reminder letter in the mail two months in advance. If you don't receive a letter, please call our office to schedule the follow-up appointment.   

## 2016-07-25 NOTE — Progress Notes (Signed)
Cardiology Office Note Date:  07/25/2016   ID:  Shelby Fitzpatrick, DOB 01/12/1966, MRN [id number]  PCP:  Leeanne Rio, PA-C  Cardiologist:  Sherren Mocha, MD    Chief Complaint  Patient presents with  . Follow-up    CAD     History of Present Illness: Shelby Fitzpatrick is a 51 y.o. female who presents for  follow-up evaluation.  The patient is followed for coronary artery disease. 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 She was treated with a drug-eluting stent in the RCA. She had minor nonobstructive disease in the left coronary distribution. Her left ventricular function has been normal. Last cath in 2017 when she presented with chest pain showed a patent stent site and no significant coronary obstructive disease.   The patient is here alone today. She is doing well from a cardiac perspective. She's been diagnosed with a gastric ulcer - was taking a lot of Excedrin Migraine. Now treated for H Pylori as well and doing better. FU endoscopy scheduled next week. No chest pain, dyspnea, edema, or palpitations.   Past Medical History:  Diagnosis Date  . CAD (coronary artery disease)    coronary intervention and drug-eluting stent placement STEMI-2011   Cath 02/25/2010:  100% RCA (PCI with Promus DES); minor LAD and CFX (nothing more than 20%)  EF 65% at cath 02/25/2010  . Ectopic pregnancy    (ruptured)  . GERD (gastroesophageal reflux disease)   . Hyperlipemia   . Hypertension   . Myocardial infarction     Past Surgical History:  Procedure Laterality Date  . ANKLE SURGERY     broken and had surgery to reapair  . APPENDECTOMY     at age 68  . CARDIAC CATHETERIZATION N/A 05/29/2015   Procedure: Left Heart Cath and Coronary Angiography;  Surgeon: Jettie Booze, MD;  Location: [facility];  Service: Cardiovascular;  Laterality: N/A;  . coronary intervention      drug-eluting stent placement-2011  . tubal pregnancy x2     cant recall year     Current Outpatient Prescriptions  Medication Sig Dispense Refill  . aspirin 81 MG tablet Take 1 tablet (81 mg total) by mouth daily.    [name] Kitchen atorvastatin (LIPITOR) 20 MG tablet Take 1 tablet (20 mg total) by mouth daily. 90 tablet 3  . cyclobenzaprine (FLEXERIL) 10 MG tablet Take 10 mg by mouth 3 times/day as needed-between meals & bedtime for muscle spasms (lower back).    . esomeprazole (NEXIUM) 40 MG capsule Take 1 capsule (40 mg total) by mouth 2 (two) times daily. 60 capsule 2  . metoprolol tartrate (LOPRESSOR) 25 MG tablet Take 1 tablet (25 mg total) by mouth 2 (two) times daily. 60 tablet 11  . nitroGLYCERIN (NITROSTAT) 0.4 MG SL tablet PLACE 1 TABLET UNDER THE TONGUE EVERY 5 MINUTES 3 TIMES AS NEEDED AS DIRECTED 25 tablet 4   Current Facility-Administered Medications  Medication Dose Route Frequency Provider Last Rate Last Dose  . 0.9 %  sodium chloride infusion  500 mL Intravenous Continuous Manus Gunning, MD        Allergies:   Biaxin [clarithromycin] and Penicillins   Social History:  The patient  reports that she has been smoking Cigarettes.  She has a 17.50 pack-year smoking history. She has never used smokeless tobacco. She reports that she drinks alcohol. She reports that she does not use drugs.   Family History:  The patient's  family history includes  Colon polyps in her sister and sister; Coronary artery disease in her father; Heart attack in her father; Hypertension in her father, mother, and sister; Stroke in her father.    ROS:  Please see the history of present illness.  Otherwise, review of systems is positive for abdominal pain, back pain, dizziness, constipation, nausea, and headaches.  All other systems are reviewed and negative.    PHYSICAL EXAM: VS:  BP 130/80   Pulse 72   Ht 5\' 7"  (1.702 m)   Wt 160 lb 6.4 oz (72.8 kg)   LMP 02/24/2013   BMI 25.12 kg/m  , BMI Body mass index is 25.12 kg/m. GEN: Well nourished, well developed, in no acute  distress  HEENT: normal  Neck: no JVD, no masses. No carotid bruits Cardiac: RRR without murmur or gallop                Respiratory:  clear to auscultation bilaterally, normal work of breathing GI: soft, nontender, nondistended, + BS MS: no deformity or atrophy  Ext: no pretibial edema, pedal pulses 2+= bilaterally Skin: warm and dry, no rash Neuro:  Strength and sensation are intact Psych: euthymic mood, full affect  EKG:  EKG is not ordered today.  Recent Labs: 03/30/2016: Hemoglobin 15.1; Platelets 207 04/15/2016: ALT 12; BUN 10; Creatinine, Ser 0.79; Potassium 4.4; Sodium 140; TSH 2.75   Lipid Panel     Component Value Date/Time   CHOL 141 07/20/2015 0814   TRIG 121 07/20/2015 0814   HDL 56 07/20/2015 0814   CHOLHDL 2.5 07/20/2015 0814   VLDL 24 07/20/2015 0814   LDLCALC 61 07/20/2015 0814      Wt Readings from Last 3 Encounters:  07/25/16 160 lb 6.4 oz (72.8 kg)  07/21/16 159 lb (72.1 kg)  07/04/16 159 lb (72.1 kg)     Cardiac Studies Reviewed: Cardiac Cath 05-29-2015: Conclusion    Patent stent in the RCA.  The left ventricular systolic function is normal.  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   Will stop Brilinta which was started in the office yesterday in anticipation of need for PCI.  Continue aggressive secondary prevention.      ASSESSMENT AND PLAN: 1.   Coronary artery disease, native vessel. No angina. Continue ASA, metoprolol, and a statin drug. Overall stable.   2. Hyperlipidemia: on a statin drug. Lipids one year ago with LDL 61 mg/dL, HDL 56 mg/dL  3. Essential hypertension, controlled. Current Rx reviewed and will be continued.  4. Tobacco: cessation advised.  Current medicines are reviewed with the patient today.  The patient does not have concerns regarding medicines.  Labs/ tests ordered today include:  No orders of the defined types were placed in this encounter.   Disposition:   FU one year  Signed, Sherren Mocha,  MD  07/25/2016 5:52 PM    [address] [place] [place], [place], [place]  [address] Phone: [phone]; Fax: [phone]

## 2016-08-04 ENCOUNTER — Ambulatory Visit (AMBULATORY_SURGERY_CENTER): Payer: 59 | Admitting: Gastroenterology

## 2016-08-04 ENCOUNTER — Encounter: Payer: Self-pay | Admitting: Gastroenterology

## 2016-08-04 VITALS — BP 120/77 | HR 60 | Temp 97.1°F | Resp 13 | Ht 67.0 in | Wt 160.0 lb

## 2016-08-04 DIAGNOSIS — K2901 Acute gastritis with bleeding: Secondary | ICD-10-CM | POA: Diagnosis not present

## 2016-08-04 DIAGNOSIS — K253 Acute gastric ulcer without hemorrhage or perforation: Secondary | ICD-10-CM | POA: Diagnosis not present

## 2016-08-04 DIAGNOSIS — K250 Acute gastric ulcer with hemorrhage: Secondary | ICD-10-CM

## 2016-08-04 MED ORDER — SODIUM CHLORIDE 0.9 % IV SOLN: 500.0000 mL | INTRAVENOUS | Status: DC

## 2016-08-04 NOTE — Progress Notes (Signed)
Pt's states no medical or surgical changes since previsit or office visit. 

## 2016-08-04 NOTE — Progress Notes (Signed)
Called to room to assist during endoscopic procedure.  Patient ID and intended procedure confirmed with present staff. Received instructions for my participation in the procedure from the performing physician.  

## 2016-08-04 NOTE — Op Note (Signed)
[facility] Patient Name: Shelby Fitzpatrick Procedure Date: 08/04/2016 7:57 AM MRN: [id number] Endoscopist: Remo Lipps P. Armbruster MD, MD Age: 51 Referring MD:  Date of Birth: [birth year] Gender: Female Account #: [id number] Procedure:                Upper GI endoscopy Indications:              Follow-up of cratered pyloric ulcer (thought to be                            due to H pylori and NSAID use), s/p treatment of H                            pylori, on Nexium Medicines:                Monitored Anesthesia Care Procedure:                Pre-Anesthesia Assessment:                           - Prior to the procedure, a History and Physical                            was performed, and patient medications and                            allergies were reviewed. The patient's tolerance of                            previous anesthesia was also reviewed. The risks                            and benefits of the procedure and the sedation                            options and risks were discussed with the patient.                            All questions were answered, and informed consent                            was obtained. Prior Anticoagulants: The patient has                            taken aspirin, last dose was 1 day prior to                            procedure. ASA Grade Assessment: III - A patient                            with severe systemic disease. After reviewing the                            risks and benefits, the patient was deemed in  satisfactory condition to undergo the procedure.                           After obtaining informed consent, the endoscope was                            passed under direct vision. Throughout the                            procedure, the patient's blood pressure, pulse, and                            oxygen saturations were monitored continuously. The                            Endoscope was introduced  through the mouth, and                            advanced to the second part of duodenum. The upper                            GI endoscopy was accomplished without difficulty.                            The patient tolerated the procedure well. Scope In: Scope Out: Findings:                 Esophagogastric landmarks were identified: the                            Z-line was found at 34 cm, the gastroesophageal                            junction was found at 34 cm and the upper extent of                            the gastric folds was found at 34 cm from the                            incisors.                           The exam of the esophagus was otherwise normal.                           One non-bleeding superficial gastric ulcer with no                            stigmata of bleeding was found at the pylorus. The                            lesion was 2-3 mm in largest dimension,  significantly improved from the last exam. Biopsies                            were taken from the periphery of it with a cold                            forceps for histology, although appears to be                            healing as expected.                           The exam of the stomach was otherwise normal.                           Biopsies were taken with a cold forceps in the                            gastric body and in the gastric antrum for                            Helicobacter pylori testing.                           The duodenal bulb and second portion of the                            duodenum were normal. Complications:            No immediate complications. Estimated blood loss:                            Minimal. Estimated Blood Loss:     Estimated blood loss was minimal. Impression:               - Esophagogastric landmarks identified.                           - Normal esophagus                           - Non-bleeding healing pyloric gastric ulcer  with                            no stigmata of bleeding, significantly improved in                            appearance since last exam, almost healed. Biopsied.                           - Normal duodenal bulb and second portion of the                            duodenum.                           -  Biopsies were taken with a cold forceps for                            Helicobacter pylori eradication testing. Recommendation:           - Patient has a contact number available for                            emergencies. The signs and symptoms of potential                            delayed complications were discussed with the                            patient. Return to normal activities tomorrow.                            Written discharge instructions were provided to the                            patient.                           - Resume previous diet.                           - Continue present medications.                           - No ibuprofen, naproxen, or other non-steroidal                            anti-inflammatory drugs.                           - Await pathology results. Remo Lipps P. Armbruster MD, MD 08/04/2016 8:26:23 AM This report has been signed electronically.

## 2016-08-04 NOTE — Progress Notes (Signed)
A/ox3, pleased with MAC, report to RN 

## 2016-08-04 NOTE — Patient Instructions (Addendum)
YOU HAD AN ENDOSCOPIC PROCEDURE TODAY AT [facility]:   Refer to the procedure report that was given to you for any specific questions about what was found during the examination.  If the procedure report does not answer your questions, please call your gastroenterologist to clarify.  If you requested that your care partner not be given the details of your procedure findings, then the procedure report has been included in a sealed envelope for you to review at your convenience later.  YOU SHOULD EXPECT: Some feelings of bloating in the abdomen. Passage of more gas than usual.  Walking can help get rid of the air that was put into your GI tract during the procedure and reduce the bloating. If you had a lower endoscopy (such as a colonoscopy or flexible sigmoidoscopy) you may notice spotting of blood in your stool or on the toilet paper. If you underwent a bowel prep for your procedure, you may not have a normal bowel movement for a few days.  Please Note:  You might notice some irritation and congestion in your nose or some drainage.  This is from the oxygen used during your procedure.  There is no need for concern and it should clear up in a day or so.  SYMPTOMS TO REPORT IMMEDIATELY:     Following upper endoscopy (EGD)  Vomiting of blood or coffee ground material  New chest pain or pain under the shoulder blades  Painful or persistently difficult swallowing  New shortness of breath  Fever of 100F or higher  Black, tarry-looking stools  For urgent or emergent issues, a gastroenterologist can be reached at any hour by calling [phone].   DIET:  We do recommend a small meal at first, but then you may proceed to your regular diet.  Drink plenty of fluids but you should avoid alcoholic beverages for 24 hours.  ACTIVITY:  You should plan to take it easy for the rest of today and you should NOT DRIVE or use heavy machinery until tomorrow (because of the sedation medicines  used during the test).    FOLLOW UP: Our staff will call the number listed on your records the next business day following your procedure to check on you and address any questions or concerns that you may have regarding the information given to you following your procedure. If we do not reach you, we will leave a message.  However, if you are feeling well and you are not experiencing any problems, there is no need to return our call.  We will assume that you have returned to your regular daily activities without incident.  If any biopsies were taken you will be contacted by phone or by letter within the next 1-3 weeks.  Please call us at [phone] if you have not heard about the biopsies in 3 weeks.    SIGNATURES/CONFIDENTIALITY: You and/or your care partner have signed paperwork which will be entered into your electronic medical record.  These signatures attest to the fact that that the information above on your After Visit Summary has been reviewed and is understood.  Full responsibility of the confidentiality of this discharge information lies with you and/or your care-partner.   NO IBUPROFEN ,NAPROXEN,OR OTHER NON STEROIDAL ANTI INFLAMMATORY MEDICATIONS   AWAIT PATHOLOGY RESULTS     CONTINUE PRESENT MEDICATIONS INCLUDING NEXIUM

## 2016-08-05 ENCOUNTER — Telehealth: Payer: Self-pay | Admitting: *Deleted

## 2016-08-05 NOTE — Telephone Encounter (Signed)
Left message on machine with Dr. Doyne Keel recommendations

## 2016-08-05 NOTE — Telephone Encounter (Signed)
  Follow up Call-  Call back number 08/04/2016 06/09/2016  Post procedure Call Back phone  # [phone]  Permission to leave phone message Yes Yes  Some recent data might be hidden     Patient questions:  Do you have a fever, pain , or abdominal swelling? No. Pain Score  0 *  Have you tolerated food without any problems? Yes.    Have you been able to return to your normal activities? Yes.    Do you have any questions about your discharge instructions: Diet   No. Medications  Yes.   Follow up visit  No.  Do you have questions or concerns about your Care? No.  Actions: * If pain score is 4 or above: No action needed, pain <4.  Dr. Havery Moros,  Could you clarify- is patient to be taking her Nexium once or twice daily?  She and her husband are unsure what you told her.  Thanks, [employer]

## 2016-08-05 NOTE — Telephone Encounter (Signed)
I would continue it twice daily for another month, and then once daily thereafter. Ulcer had significantly healed but not completely. Thanks

## 2016-08-08 ENCOUNTER — Encounter: Payer: Self-pay | Admitting: Gastroenterology

## 2016-08-13 ENCOUNTER — Ambulatory Visit: Payer: Managed Care, Other (non HMO) | Admitting: Cardiovascular Disease

## 2016-10-23 ENCOUNTER — Other Ambulatory Visit: Payer: Self-pay | Admitting: Gastroenterology

## 2016-10-23 DIAGNOSIS — R1013 Epigastric pain: Secondary | ICD-10-CM

## 2017-01-14 LAB — HM MAMMOGRAPHY

## 2017-01-21 ENCOUNTER — Encounter: Payer: Self-pay | Admitting: Emergency Medicine

## 2017-06-17 ENCOUNTER — Encounter: Payer: Self-pay | Admitting: Physician Assistant

## 2017-06-17 ENCOUNTER — Ambulatory Visit: Payer: 59 | Admitting: Physician Assistant

## 2017-06-17 ENCOUNTER — Other Ambulatory Visit: Payer: Self-pay

## 2017-06-17 ENCOUNTER — Ambulatory Visit (INDEPENDENT_AMBULATORY_CARE_PROVIDER_SITE_OTHER): Payer: 59

## 2017-06-17 VITALS — BP 120/82 | HR 65 | Temp 98.6°F | Resp 14 | Ht 67.0 in | Wt 167.0 lb

## 2017-06-17 DIAGNOSIS — J208 Acute bronchitis due to other specified organisms: Secondary | ICD-10-CM

## 2017-06-17 DIAGNOSIS — B9689 Other specified bacterial agents as the cause of diseases classified elsewhere: Secondary | ICD-10-CM

## 2017-06-17 MED ORDER — BENZONATATE 100 MG PO CAPS: 100.0000 mg | ORAL_CAPSULE | Freq: Three times a day (TID) | ORAL | 0 refills | Status: DC | PRN

## 2017-06-17 MED ORDER — ALBUTEROL SULFATE (2.5 MG/3ML) 0.083% IN NEBU
2.5000 mg | INHALATION_SOLUTION | Freq: Once | RESPIRATORY_TRACT | Status: AC
  Administered 2017-06-17: 2.5 mg via RESPIRATORY_TRACT

## 2017-06-17 MED ORDER — BECLOMETHASONE DIPROP HFA 80 MCG/ACT IN AERB: 1.0000 | INHALATION_SPRAY | Freq: Two times a day (BID) | RESPIRATORY_TRACT | 0 refills | Status: DC

## 2017-06-17 MED ORDER — DOXYCYCLINE HYCLATE 100 MG PO CAPS: 100.0000 mg | ORAL_CAPSULE | Freq: Two times a day (BID) | ORAL | 0 refills | Status: DC

## 2017-06-17 NOTE — Progress Notes (Signed)
Patient presents to clinic today c/o 1 week of rhinorrhea turning into dry cough, sore throat, sinus pressure, ear pressure. Denies fever. Notes fatigue and a bit sweaty from time to time. Notes some mild nausea. Cough is worse at night and a bit wheezy per patient. Has taken Mucinex and Sudafed. Today is noting some left sided maxillary pain today. Denies recent travel.  Past Medical History:  Diagnosis Date  . CAD (coronary artery disease)    coronary intervention and drug-eluting stent placement STEMI-2011   Cath 02/25/2010:  100% RCA (PCI with Promus DES); minor LAD and CFX (nothing more than 20%)  EF 65% at cath 02/25/2010  . Ectopic pregnancy    (ruptured)  . GERD (gastroesophageal reflux disease)   . Hyperlipemia   . Hypertension   . Myocardial infarction [facility]     Current Outpatient Medications on File Prior to Visit  Medication Sig Dispense Refill  . aspirin 81 MG tablet Take 1 tablet (81 mg total) by mouth daily.    [name] Kitchen atorvastatin (LIPITOR) 20 MG tablet Take 1 tablet (20 mg total) by mouth daily. 90 tablet 3  . cyclobenzaprine (FLEXERIL) 10 MG tablet Take 10 mg by mouth 3 times/day as needed-between meals & bedtime for muscle spasms (lower back).    . esomeprazole (NEXIUM) 40 MG capsule TAKE 1 CAPSULE(40 MG) BY MOUTH TWICE DAILY 60 capsule 0  . metoprolol tartrate (LOPRESSOR) 25 MG tablet Take 1 tablet (25 mg total) by mouth 2 (two) times daily. 60 tablet 11  . nitroGLYCERIN (NITROSTAT) 0.4 MG SL tablet PLACE 1 TABLET UNDER THE TONGUE EVERY 5 MINUTES 3 TIMES AS NEEDED AS DIRECTED 25 tablet 4   No current facility-administered medications on file prior to visit.     Allergies  Allergen Reactions  . Biaxin [Clarithromycin] Nausea Only  . Penicillins Rash    Family History  Problem Relation Age of Onset  . Coronary artery disease Father   . Heart attack Father   . Hypertension Father   . Stroke Father   . Hypertension Mother   . Hypertension Sister   . Colon  polyps Sister   . Colon polyps Sister   . Colon cancer Neg Hx   . Stomach cancer Neg Hx   . Rectal cancer Neg Hx   . Esophageal cancer Neg Hx   . Liver cancer Neg Hx     Social History   Socioeconomic History  . Marital status: Married    Spouse name: None  . Number of children: 1  . Years of education: None  . Highest education level: None  Social Needs  . Financial resource strain: None  . Food insecurity - worry: None  . Food insecurity - inability: None  . Transportation needs - medical: None  . Transportation needs - non-medical: None  Occupational History  . Occupation: Dentist  Tobacco Use  . Smoking status: Current Every Day Smoker    Packs/day: 0.50    Years: 35.00    Pack years: 17.50    Types: Cigarettes  . Smokeless tobacco: Never Used  Substance and Sexual Activity  . Alcohol use: Yes    Alcohol/week: 0.0 oz    Comment: rare  . Drug use: No  . Sexual activity: None  Other Topics Concern  . None  Social History Narrative  . None   Review of Systems - See HPI.  All other ROS are negative.  BP 120/82   Pulse 65   Temp 98.6  F (37 C) (Oral)   Resp 14   Ht 5\' 7"  (1.702 m)   Wt 167 lb (75.8 kg)   LMP 02/24/2013   SpO2 97%   BMI 26.16 kg/m   Physical Exam  Constitutional: She is oriented to person, place, and time and well-developed, well-nourished, and in no distress.  HENT:  Head: Normocephalic and atraumatic.  Right Ear: External ear normal.  Left Ear: External ear normal.  Nose: Nose normal.  Mouth/Throat: Oropharynx is clear and moist.  TM within normal limits bilaterally.  Eyes: Conjunctivae are normal.  Neck: Neck supple.  Cardiovascular: Normal rate, regular rhythm, normal heart sounds and intact distal pulses.  Pulmonary/Chest: No respiratory distress. She has decreased breath sounds in the left lower field. She has wheezes. She has no rales. She exhibits no tenderness.  Neurological: She is alert and oriented to person, place,  and time.  Skin: Skin is warm and dry. No rash noted.  Psychiatric: Affect normal.  Vitals reviewed.  Assessment/Plan: 1. Acute bacterial bronchitis CXR negative. Will start Doxycycline, Tessalon and Qvar for wheeze. Supportive measures and OTC medications reviewed. Follow-up if not improving.  - DG Chest 2 View; Future - benzonatate (TESSALON) 100 MG capsule; Take 1 capsule (100 mg total) by mouth 3 (three) times daily as needed for cough.  Dispense: 30 capsule; Refill: 0 - albuterol (PROVENTIL) (2.5 MG/3ML) 0.083% nebulizer solution 2.5 mg - doxycycline (VIBRAMYCIN) 100 MG capsule; Take 1 capsule (100 mg total) by mouth 2 (two) times daily.  Dispense: 14 capsule; Refill: 0   Leeanne Rio, PA-C

## 2017-06-17 NOTE — Patient Instructions (Addendum)
Please go to the [facility] office for x-ray. I will call you shortly with these results and we will change your antibiotic regimen accordingly.   Please continue Mucinex (plain) to thin congestion. Use the Tessalon as directed for cough.  Start the low-dose Qvar during this illness to help with wheezing.

## 2017-06-19 ENCOUNTER — Telehealth: Payer: Self-pay | Admitting: *Deleted

## 2017-06-19 ENCOUNTER — Encounter: Payer: Self-pay | Admitting: Physician Assistant

## 2017-06-19 MED ORDER — AZITHROMYCIN 250 MG PO TABS: ORAL_TABLET | ORAL | 0 refills | Status: DC

## 2017-06-19 NOTE — Telephone Encounter (Signed)
LM for patient letting her know she needs to finish the abx that was prescribed.  Since she just started the medication she has to give it time to work to know if we can tell a difference.     Advised patient that if she is no better after finishing the abx, to call us back for re-evaluation.    Copied from [place]. Topic: General - Other >> Jun 19, 2017  8:09 AM Carolyn Stare wrote:  Pt call to say she saw the doctor on 06/17/17 and he gave her the following med and she said it is not working and is asking if a zpack can be called in   doxycycline (VIBRAMYCIN) 100 MG capsule   Pharmacy   [facility]

## 2017-06-29 ENCOUNTER — Other Ambulatory Visit: Payer: Self-pay | Admitting: Gastroenterology

## 2017-06-29 ENCOUNTER — Telehealth: Payer: Self-pay

## 2017-06-29 DIAGNOSIS — R1013 Epigastric pain: Secondary | ICD-10-CM

## 2017-06-29 NOTE — Telephone Encounter (Signed)
rec'd refill request from [facility] for Nexium. Called pt to see how she was doing: Dr. Loni Muse had wanted her to decrease Nexium to once a day and then return for F/U in summer of 2018 but she was not seen and does not have an appt. Pt indicated she really does Not need a refill and is only taking Nexium sporadically. I asked if she would like to make an appt and she declined. She understands that if she wants further refills she should make an appt to F/U.  She appreciated the call and is doing well.  She indicated she will follow up PRN.

## 2017-06-29 NOTE — Telephone Encounter (Signed)
Okay thanks Jan, I appreciate the follow up

## 2017-07-09 ENCOUNTER — Other Ambulatory Visit: Payer: Self-pay | Admitting: Cardiovascular Disease

## 2017-07-31 ENCOUNTER — Encounter: Payer: Self-pay | Admitting: Cardiovascular Disease

## 2017-07-31 ENCOUNTER — Ambulatory Visit: Payer: 59 | Admitting: Cardiovascular Disease

## 2017-07-31 VITALS — BP 150/90 | HR 74 | Ht 67.0 in | Wt 165.8 lb

## 2017-07-31 DIAGNOSIS — I2510 Atherosclerotic heart disease of native coronary artery without angina pectoris: Secondary | ICD-10-CM

## 2017-07-31 DIAGNOSIS — I10 Essential (primary) hypertension: Secondary | ICD-10-CM

## 2017-07-31 DIAGNOSIS — E785 Hyperlipidemia, unspecified: Secondary | ICD-10-CM

## 2017-07-31 NOTE — Patient Instructions (Signed)
Medication Instructions:  Your provider recommends that you continue on your current medications as directed. Please refer to the Current Medication list given to you today.    Labwork: Your provider recommends that you return for FASTING lab work.     Testing/Procedures: None ordered  Follow-Up: Your provider wants you to follow-up in: 1 year with Dr. Burt Knack. You will receive a reminder letter in the mail two months in advance. If you don't receive a letter, please call our office to schedule the follow-up appointment.    Any Other Special Instructions Will Be Listed Below (If Applicable).     If you need a refill on your cardiac medications before your next appointment, please call your pharmacy.

## 2017-07-31 NOTE — Progress Notes (Signed)
Cardiology Office Note Date:  08/01/2017   ID:  Shelby Fitzpatrick, DOB June 18, 1965, MRN [id number]  PCP:  [name], PA-C  Cardiologist:  Sherren Mocha, MD    Chief Complaint  Patient presents with  . Headache     History of Present Illness: Shelby Fitzpatrick is a 52 y.o. female who presents for follow-up of coronary artery disease.  She is been followed since initially presenting with an inferior wall MI in 2011 treated with a drug-eluting stent in the right coronary artery.  She underwent repeat heart catheterization in 2017 demonstrating stent patency and no other significant CAD.  The patient is here alone today.  She is been doing relatively well.  She woke up with a headache today and attributes her high blood pressure reading to that problem.  She has not had any recent chest pain or pressure, shortness of breath, leg swelling, orthopnea, PND, or heart palpitations.  She started a low carbohydrate diet about 2 weeks ago.  She took prednisone last year and feels that this caused 6-8 pounds of weight gain.  She continues to smoke cigarettes and is trying to quit.   Past Medical History:  Diagnosis Date  . CAD (coronary artery disease)    coronary intervention and drug-eluting stent placement STEMI-2011   Cath 02/25/2010:  100% RCA (PCI with Promus DES); minor LAD and CFX (nothing more than 20%)  EF 65% at cath 02/25/2010  . Ectopic pregnancy    (ruptured)  . GERD (gastroesophageal reflux disease)   . Hyperlipemia   . Hypertension   . Myocardial infarction [facility])     Past Surgical History:  Procedure Laterality Date  . ANKLE SURGERY     broken and had surgery to reapair  . APPENDECTOMY     at age 66  . CARDIAC CATHETERIZATION N/A 05/29/2015   Procedure: Left Heart Cath and Coronary Angiography;  Surgeon: Jettie Booze, MD;  Location: [place] [facility];  Service: Cardiovascular;  Laterality: N/A;  . coronary intervention      drug-eluting stent placement-2011  .  tubal pregnancy x2     cant recall year    Current Outpatient Medications  Medication Sig Dispense Refill  . aspirin 81 MG tablet Take 1 tablet (81 mg total) by mouth daily.    [name] Kitchen atorvastatin (LIPITOR) 20 MG tablet Take 1 tablet (20 mg total) by mouth daily. 90 tablet 3  . cyclobenzaprine (FLEXERIL) 10 MG tablet Take 10 mg by mouth 3 times/day as needed-between meals & bedtime for muscle spasms (lower back).    . esomeprazole (NEXIUM) 40 MG capsule TAKE 1 CAPSULE(40 MG) BY MOUTH TWICE DAILY 60 capsule 0  . metoprolol tartrate (LOPRESSOR) 25 MG tablet TAKE 1 TABLET(25 MG) BY MOUTH TWICE DAILY 60 tablet 0  . nitroGLYCERIN (NITROSTAT) 0.4 MG SL tablet PLACE 1 TABLET UNDER THE TONGUE EVERY 5 MINUTES 3 TIMES AS NEEDED AS DIRECTED 25 tablet 4   No current facility-administered medications for this visit.     Allergies:   Biaxin [clarithromycin] and Penicillins   Social History:  The patient  reports that she has been smoking cigarettes.  She has a 17.50 pack-year smoking history. She has never used smokeless tobacco. She reports that she drinks alcohol. She reports that she does not use drugs.   Family History:  The patient's  family history includes Colon polyps in her sister and sister; Coronary artery disease in her father; Heart attack in her father; Hypertension in her father, mother,  and sister; Stroke in her father.    ROS:  Please see the history of present illness.   All other systems are reviewed and negative.    PHYSICAL EXAM: VS:  BP (!) 150/90   Pulse 74   Ht 5\' 7"  (1.702 m)   Wt 165 lb 12.8 oz (75.2 kg)   LMP 02/24/2013   SpO2 96%   BMI 25.97 kg/m  , BMI Body mass index is 25.97 kg/m. GEN: Well nourished, well developed, in no acute distress  HEENT: normal  Neck: no JVD, no masses. No carotid bruits Cardiac: RRR with 2/6 systolic ejection murmur at the right upper sternal border               Respiratory:  clear to auscultation bilaterally, normal work of  breathing GI: soft, nontender, nondistended, + BS MS: no deformity or atrophy  Ext: no pretibial edema, pedal pulses 2+= bilaterally Skin: warm and dry, no rash Neuro:  Strength and sensation are intact Psych: euthymic mood, full affect  EKG:  EKG is ordered today. The ekg ordered today shows normal sinus rhythm 74 bpm, sinus arrhythmia, nonspecific T wave abnormality.  Recent Labs: No results found for requested labs within last 8760 hours.   Lipid Panel     Component Value Date/Time   CHOL 141 07/20/2015 0814   TRIG 121 07/20/2015 0814   HDL 56 07/20/2015 0814   CHOLHDL 2.5 07/20/2015 0814   VLDL 24 07/20/2015 0814   LDLCALC 61 07/20/2015 0814      Wt Readings from Last 3 Encounters:  07/31/17 165 lb 12.8 oz (75.2 kg)  06/17/17 167 lb (75.8 kg)  08/04/16 160 lb (72.6 kg)     ASSESSMENT AND PLAN: 1.  Coronary artery disease, native vessel, without angina: I reviewed her most recent heart catheterization as above.  The patient appears to be clinically stable on antiplatelet therapy with aspirin, lipid-lowering with atorvastatin, and a beta-blocker.  2.  Hypertension: Reviewed her blood pressure readings today.  The majority are at goal.  She has a headache and that is probably why her blood pressure is increased today.  States she had 2 recent office visits with her primary physician and blood pressure was good at those visits.  We will schedule her for a metabolic panel.  3.  Hyperlipidemia: Treated with atorvastatin 20 mg daily.  Her lab work is outdated.  We will schedule her for lipids and LFTs.  4.  Tobacco abuse: Cessation counseling is done today.  Current medicines are reviewed with the patient today.  The patient does not have concerns regarding medicines.  Labs/ tests ordered today include:   Orders Placed This Encounter  Procedures  . Comprehensive metabolic panel  . CBC with Differential/Platelet  . Lipid panel  . EKG 12-Lead    Disposition:   FU one  year  Signed, Sherren Mocha, MD  08/01/2017 12:47 PM    [address], [place], [place]  [address] Phone: [phone]; Fax: [phone]

## 2017-08-03 ENCOUNTER — Other Ambulatory Visit: Payer: 59 | Admitting: *Deleted

## 2017-08-03 DIAGNOSIS — I2510 Atherosclerotic heart disease of native coronary artery without angina pectoris: Secondary | ICD-10-CM

## 2017-08-03 LAB — COMPREHENSIVE METABOLIC PANEL
ALT: 27 IU/L (ref 0–32)
AST: 24 IU/L (ref 0–40)
Albumin/Globulin Ratio: 1.8 (ref 1.2–2.2)
Albumin: 4.2 g/dL (ref 3.5–5.5)
Alkaline Phosphatase: 76 IU/L (ref 39–117)
BILIRUBIN TOTAL: 0.3 mg/dL (ref 0.0–1.2)
BUN/Creatinine Ratio: 21 (ref 9–23)
BUN: 17 mg/dL (ref 6–24)
CHLORIDE: 102 mmol/L (ref 96–106)
CO2: 22 mmol/L (ref 20–29)
Calcium: 10.3 mg/dL — ABNORMAL HIGH (ref 8.7–10.2)
Creatinine, Ser: 0.8 mg/dL (ref 0.57–1.00)
GFR calc Af Amer: 99 mL/min/{1.73_m2} (ref >59)
GFR calc non Af Amer: 86 mL/min/{1.73_m2} (ref >59)
GLUCOSE: 99 mg/dL (ref 65–99)
Globulin, Total: 2.4 g/dL (ref 1.5–4.5)
Potassium: 5 mmol/L (ref 3.5–5.2)
Sodium: 138 mmol/L (ref 134–144)
Total Protein: 6.6 g/dL (ref 6.0–8.5)

## 2017-08-03 LAB — CBC WITH DIFFERENTIAL/PLATELET
BASOS ABS: 0 10*3/uL (ref 0.0–0.2)
Basos: 1 %
EOS (ABSOLUTE): 0.2 10*3/uL (ref 0.0–0.4)
Eos: 3 %
Hematocrit: 43.5 % (ref 34.0–46.6)
Hemoglobin: 14.9 g/dL (ref 11.1–15.9)
Immature Grans (Abs): 0 10*3/uL (ref 0.0–0.1)
Immature Granulocytes: 0 %
LYMPHS ABS: 1.6 10*3/uL (ref 0.7–3.1)
LYMPHS: 27 %
MCH: 32.5 pg (ref 26.6–33.0)
MCHC: 34.3 g/dL (ref 31.5–35.7)
MCV: 95 fL (ref 79–97)
Monocytes Absolute: 0.5 10*3/uL (ref 0.1–0.9)
Monocytes: 8 %
NEUTROS ABS: 3.7 10*3/uL (ref 1.4–7.0)
Neutrophils: 61 %
PLATELETS: 213 10*3/uL (ref 150–379)
RBC: 4.59 x10E6/uL (ref 3.77–5.28)
RDW: 14.2 % (ref 12.3–15.4)
WBC: 5.9 10*3/uL (ref 3.4–10.8)

## 2017-08-03 LAB — LIPID PANEL
CHOL/HDL RATIO: 2.3 ratio (ref 0.0–4.4)
Cholesterol, Total: 132 mg/dL (ref 100–199)
HDL: 57 mg/dL (ref >39)
LDL CALC: 50 mg/dL (ref 0–99)
TRIGLYCERIDES: 126 mg/dL (ref 0–149)
VLDL Cholesterol Cal: 25 mg/dL (ref 5–40)

## 2017-08-09 ENCOUNTER — Other Ambulatory Visit: Payer: Self-pay | Admitting: Cardiovascular Disease

## 2017-08-11 ENCOUNTER — Other Ambulatory Visit: Payer: Self-pay | Admitting: Cardiovascular Disease

## 2017-08-12 MED ORDER — ATORVASTATIN CALCIUM 20 MG PO TABS: ORAL_TABLET | ORAL | 3 refills | Status: DC

## 2017-08-12 NOTE — Addendum Note (Signed)
Addended by: Gaetano Net on: 08/12/2017 09:48 AM   Modules accepted: Orders

## 2017-09-29 ENCOUNTER — Encounter: Payer: Self-pay | Admitting: Cardiovascular Disease

## 2017-09-29 DIAGNOSIS — I10 Essential (primary) hypertension: Secondary | ICD-10-CM | POA: Insufficient documentation

## 2017-09-29 NOTE — Progress Notes (Signed)
Cardiology Office Note:    Date:  09/30/2017   ID:  Shelby Fitzpatrick, DOB 29-Aug-1965, MRN [id number]  PCP:  [name], PA-C  Cardiologist:  Sherren Mocha, MD   Referring MD: [name], PA-C   Chief Complaint  Patient presents with  . Chest Pain    History of Present Illness:    Shelby Fitzpatrick is a 52 y.o. female with coronary artery disease, s/p inferior MI in 2011 treated with a DES to the RCA, hypertension, hyperlipidemia.  Last seen by Dr. Sherren Mocha in 07/2017.    [name] returns for evaluation of chest pain.  She is here alone.  Over the past few days, she has noted lower chest/epigastric discomfort.  She describes it as stabbing.  It comes and goes.  It is not related to exertion.  She has noted it with lying prone as well as some positional changes.  She denies shortness of breath, PND, edema, syncope.  She has occasional palpitations.  Her discomfort is in the same location as her heart attack.  However, it is not the same discomfort.  It seems to be more like the discomfort she had when she was diagnosed with peptic ulcer disease.  Prior CV studies:   The following studies were reviewed today:  Cardiac Catheterization 05/29/15 LAD mid myocardial bridging RCA prox 20, stent patent EF 55-65  Stress Echo 08/07/10 - With stress there is normal increase in thickening and contractility. There is normal decrease in cavity size. This is a normal stress echo study.  Past Medical History:  Diagnosis Date  . CAD (coronary artery disease)    coronary intervention and drug-eluting stent placement STEMI-2011   Cath 02/25/2010:  100% RCA (PCI with Promus DES); minor LAD and CFX (nothing more than 20%)  EF 65% at cath 02/25/2010  . Ectopic pregnancy    (ruptured)  . GERD (gastroesophageal reflux disease)   . Hyperlipemia   . Hypertension   . Myocardial infarction [facility])    Surgical Hx: The patient  has a past surgical history that includes coronary  intervention; Cardiac catheterization (N/A, 05/29/2015); Appendectomy; tubal pregnancy x2; and Ankle surgery.   Current Medications: Current Meds  Medication Sig  . aspirin 81 MG tablet Take 1 tablet (81 mg total) by mouth daily.  [name] Kitchen atorvastatin (LIPITOR) 20 MG tablet TAKE 1 TABLET(20 MG) BY MOUTH DAILY  . cyclobenzaprine (FLEXERIL) 10 MG tablet Take 10 mg by mouth 3 times/day as needed-between meals & bedtime for muscle spasms (lower back).  . esomeprazole (NEXIUM) 40 MG capsule TAKE 1 CAPSULE(40 MG) BY MOUTH TWICE DAILY  . metoprolol tartrate (LOPRESSOR) 25 MG tablet TAKE 1 TABLET(25 MG) BY MOUTH TWICE DAILY  . nitroGLYCERIN (NITROSTAT) 0.4 MG SL tablet PLACE 1 TABLET UNDER THE TONGUE EVERY 5 MINUTES 3 TIMES AS NEEDED AS DIRECTED     Allergies:   Biaxin [clarithromycin] and Penicillins   Social History   Tobacco Use  . Smoking status: Current Every Day Smoker    Packs/day: 0.50    Years: 35.00    Pack years: 17.50    Types: Cigarettes  . Smokeless tobacco: Never Used  Substance Use Topics  . Alcohol use: Yes    Alcohol/week: 0.0 oz    Comment: rare  . Drug use: No     Family Hx: The patient's family history includes Colon polyps in her sister and sister; Coronary artery disease in her father; Heart attack in her father; Hypertension in her father, mother, and  sister; Stroke in her father. There is no history of Colon cancer, Stomach cancer, Rectal cancer, Esophageal cancer, or Liver cancer.  ROS:   Please see the history of present illness.    Review of Systems  Cardiovascular: Positive for chest pain.   All other systems reviewed and are negative.   EKGs/Labs/Other Test Reviewed:    EKG:  EKG is  ordered today.  The ekg ordered today demonstrates normal sinus rhythm, heart rate 62, normal axis, nonspecific ST-T wave changes, QTC 416, similar to prior tracing dated 07/31/2017  Recent Labs: 08/03/2017: ALT 27; BUN 17; Creatinine, Ser 0.80; Hemoglobin 14.9; Platelets 213;  Potassium 5.0; Sodium 138   Recent Lipid Panel Lab Results  Component Value Date/Time   CHOL 132 08/03/2017 07:58 AM   TRIG 126 08/03/2017 07:58 AM   HDL 57 08/03/2017 07:58 AM   CHOLHDL 2.3 08/03/2017 07:58 AM   CHOLHDL 2.5 07/20/2015 08:14 AM   LDLCALC 50 08/03/2017 07:58 AM    Physical Exam:    VS:  BP 130/84   Pulse 67   Ht 5\' 7"  (1.702 m)   Wt 166 lb (75.3 kg)   LMP 02/24/2013   SpO2 97%   BMI 26.00 kg/m     Wt Readings from Last 3 Encounters:  09/30/17 166 lb (75.3 kg)  07/31/17 165 lb 12.8 oz (75.2 kg)  06/17/17 167 lb (75.8 kg)     Physical Exam  Constitutional: She is oriented to person, place, and time. She appears well-developed and well-nourished. No distress.  HENT:  Head: Normocephalic and atraumatic.  Neck: Neck supple. No JVD present.  Cardiovascular: Normal rate, regular rhythm, S1 normal, S2 normal and normal heart sounds.  No murmur heard. Pulmonary/Chest: Effort normal. She has no rales.  Abdominal: Soft. There is no hepatomegaly. There is no tenderness.  Musculoskeletal: She exhibits no edema.  Neurological: She is alert and oriented to person, place, and time.  Skin: Skin is warm and dry.    ASSESSMENT & PLAN:    Chest pain, unspecified type  She presents with symptoms that are fairly atypical for ischemia.  However, her symptoms are in the same location as her prior angina.  She denies exertional symptoms and her ECG is unchanged.  Her symptoms are more reminiscent of what she had with peptic ulcer disease.  She was treated for H. pylori last year and has been on high-dose proton pump inhibitor therapy.  However, recently, she has been taking Nexium every other day.  She continues to smoke cigarettes.  Her last assessment for ischemia was in 2017.  I suspect her symptoms are related to gastritis or peptic ulcer disease.  However, ischemia should be ruled out.  -Arrange plain exercise treadmill test  -Increase Nexium to 40 mg once daily  -If GXT  normal, follow-up with gastroenterology  Coronary artery disease involving native coronary artery of native heart without angina pectoris History of inferior MI in 2011 treated with a drug-eluting stent to the RCA.  Cardiac catheterization in 2017 demonstrated patent RCA stent and no significant disease elsewhere.  As noted, she has recently had some atypical chest symptoms.  Her symptoms are probably more related to a gastrointestinal etiology than coronary ischemia.  However, stress testing will be arranged.  Continue aspirin, statin, beta-blocker.  She has been advised to stop smoking.  Hyperlipidemia, unspecified hyperlipidemia type LDL optimal on most recent lab work.  Continue current Rx.    Essential hypertension The patient's blood pressure is controlled on  her current regimen.  Continue current therapy.    Dispo:  Return in about 10 months (around 08/01/2018) for Routine Follow Up, w/ Dr. Burt Knack.   Medication Adjustments/Labs and Tests Ordered: Current medicines are reviewed at length with the patient today.  Concerns regarding medicines are outlined above.  Tests Ordered: Orders Placed This Encounter  Procedures  . Exercise Tolerance Test  . EKG 12-Lead   Medication Changes: No orders of the defined types were placed in this encounter.   Signed, Richardson Dopp, PA-C  09/30/2017 9:57 AM    [facility] [address], [place], [place]  [address] Phone: [phone]; Fax: [phone]

## 2017-09-30 ENCOUNTER — Encounter: Payer: Self-pay | Admitting: Physician Assistant

## 2017-09-30 ENCOUNTER — Ambulatory Visit: Payer: 59 | Admitting: Physician Assistant

## 2017-09-30 VITALS — BP 130/84 | HR 67 | Ht 67.0 in | Wt 166.0 lb

## 2017-09-30 DIAGNOSIS — E785 Hyperlipidemia, unspecified: Secondary | ICD-10-CM | POA: Diagnosis not present

## 2017-09-30 DIAGNOSIS — I2510 Atherosclerotic heart disease of native coronary artery without angina pectoris: Secondary | ICD-10-CM | POA: Diagnosis not present

## 2017-09-30 DIAGNOSIS — I10 Essential (primary) hypertension: Secondary | ICD-10-CM

## 2017-09-30 DIAGNOSIS — R079 Chest pain, unspecified: Secondary | ICD-10-CM | POA: Diagnosis not present

## 2017-09-30 MED ORDER — ESOMEPRAZOLE MAGNESIUM 40 MG PO CPDR: 40.0000 mg | DELAYED_RELEASE_CAPSULE | Freq: Every day | ORAL | 3 refills | Status: DC

## 2017-09-30 NOTE — Addendum Note (Signed)
Addended by: Michae Kava on: 09/30/2017 10:08 AM   Modules accepted: Orders

## 2017-09-30 NOTE — Patient Instructions (Signed)
Medication Instructions:  1. Your physician recommends that you continue on your current medications as directed. Please refer to the Current Medication list given to you today.   Labwork: NONE ORDERED TODAY  Testing/Procedures: Your physician has requested that you have an exercise tolerance test. For further information please visit [email or web address]. Please also follow instruction sheet, as given.    Follow-Up: 07/2018 WITH DR. Burt Knack; WE WILL SEND OUT A REMINDER LETTER A FEW MONTHS EARLIER TO MAKE AN APPT .   Any Other Special Instructions Will Be Listed Below (If Applicable).     If you need a refill on your cardiac medications before your next appointment, please call your pharmacy.

## 2017-10-06 ENCOUNTER — Telehealth: Payer: Self-pay | Admitting: Physician Assistant

## 2017-10-06 NOTE — Telephone Encounter (Signed)
New message    Patient cancelled ETT - does not want to have test right now - everything is doing better NJm  - just wanted to let you know

## 2017-10-08 ENCOUNTER — Other Ambulatory Visit: Payer: Self-pay | Admitting: Cardiovascular Disease

## 2017-11-16 ENCOUNTER — Other Ambulatory Visit: Payer: Self-pay | Admitting: *Deleted

## 2017-11-16 DIAGNOSIS — I259 Chronic ischemic heart disease, unspecified: Secondary | ICD-10-CM

## 2017-11-16 MED ORDER — NITROGLYCERIN 0.4 MG SL SUBL: 0.4000 mg | SUBLINGUAL_TABLET | SUBLINGUAL | 3 refills | Status: DC | PRN

## 2018-01-16 IMAGING — US US TRANSVAGINAL NON-OB
1 series · 13 of 25 positions shown · non-contrast
Comparison: 01/16/2016

CLINICAL DATA: Complex cystic lesions within the ovaries



[Series 1: us transvaginal non-ob · 0.23mm/px · 13 of 99 slices shown]
[im 1/99]
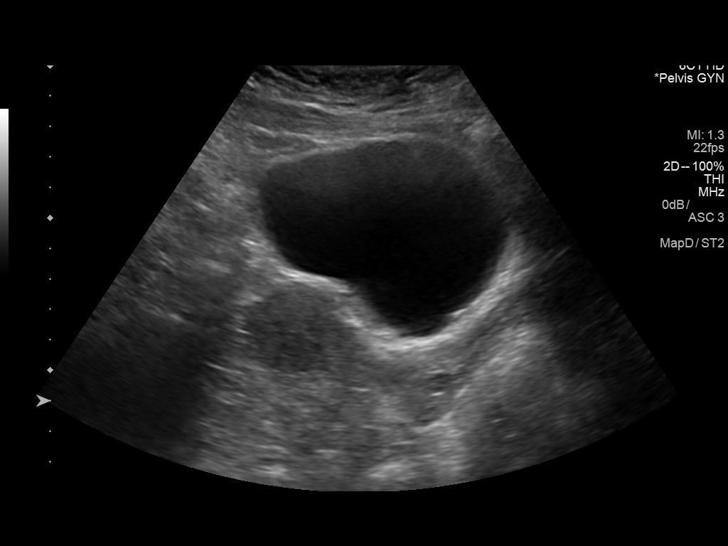
[im 9/99]
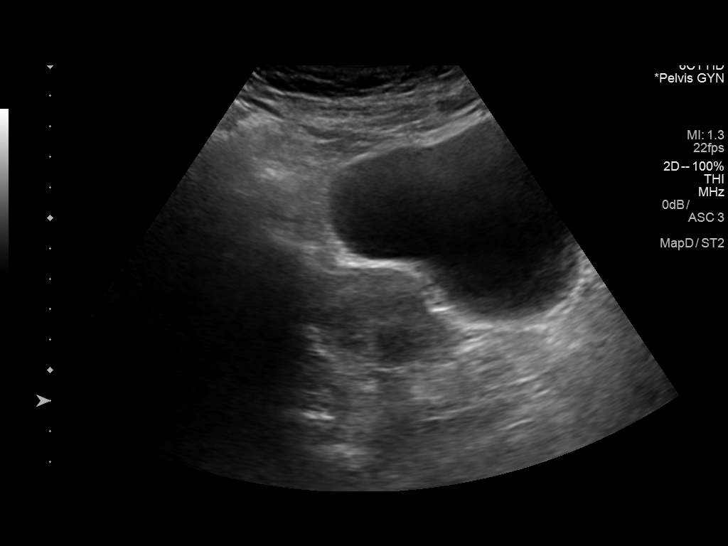
[im 17/99]
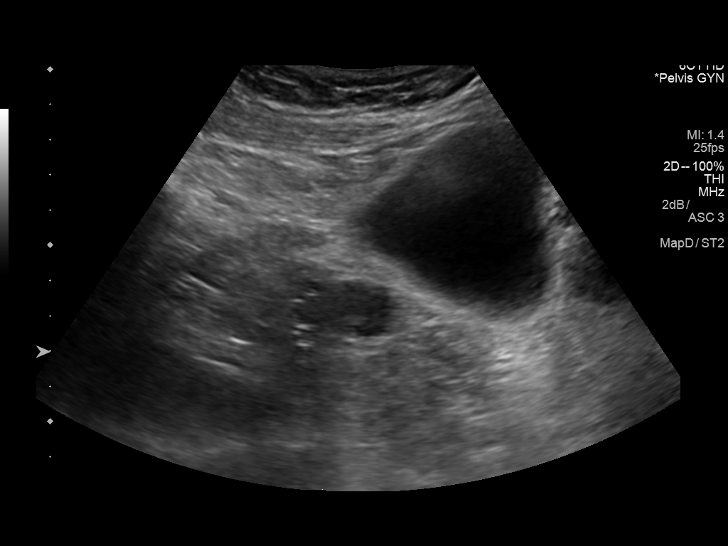
[im 25/99]
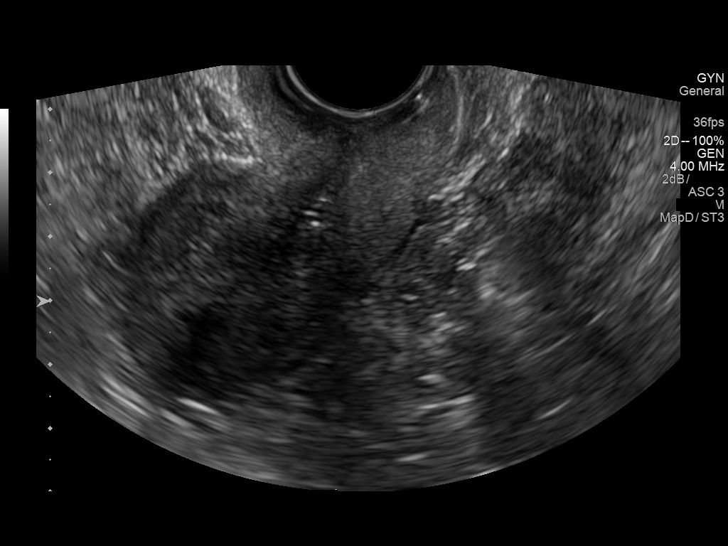
[im 33/99]
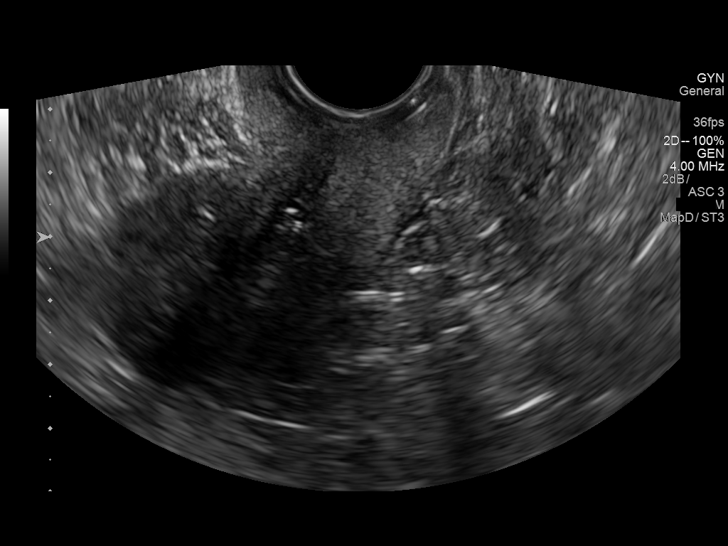
[im 41/99]
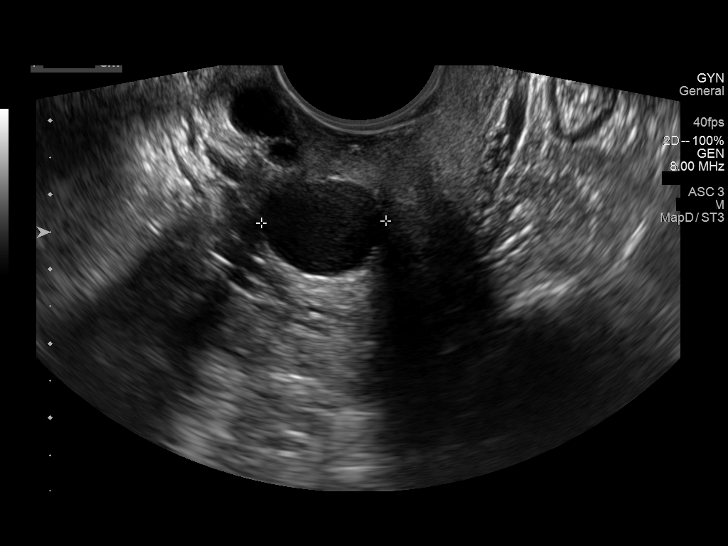
[im 50/99]
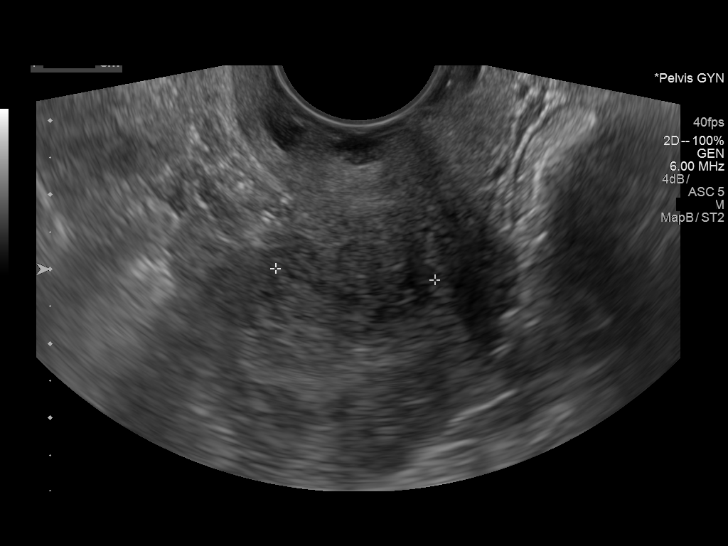
[im 58/99]
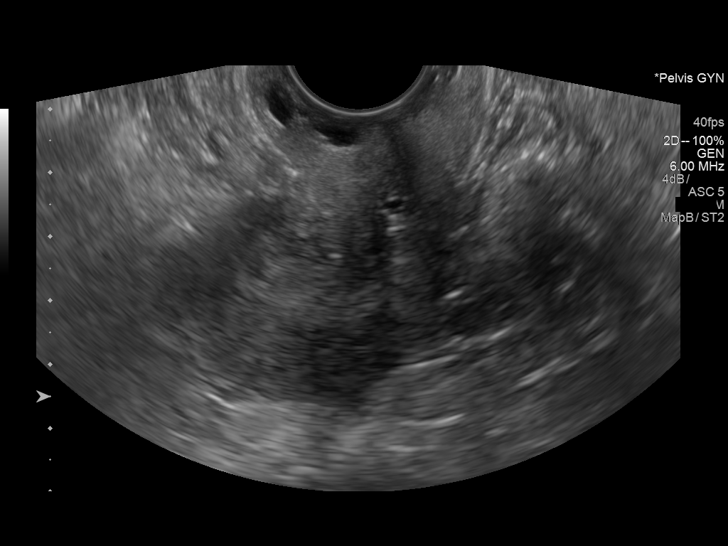
[im 66/99]
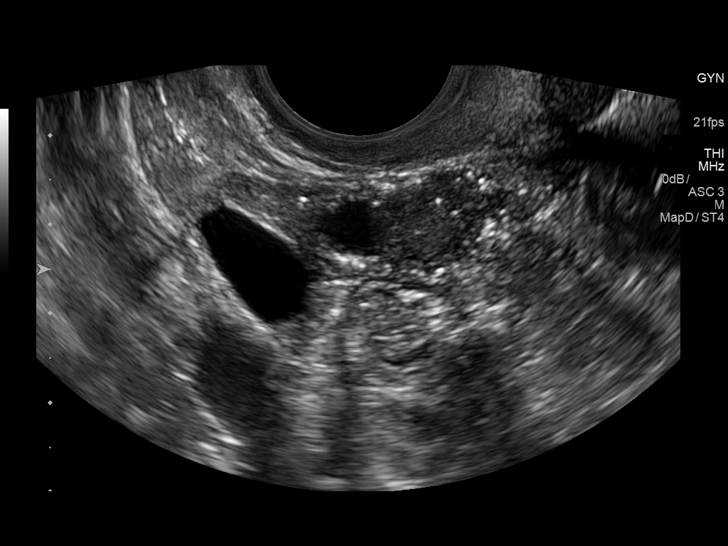
[im 74/99]
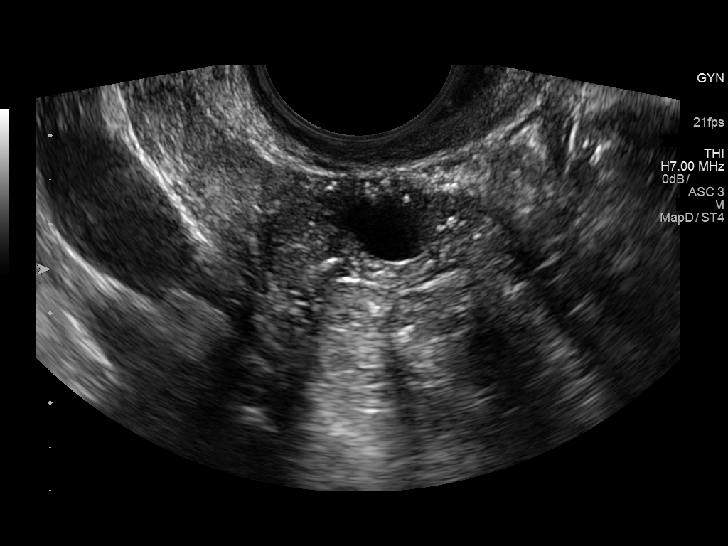
[im 82/99]
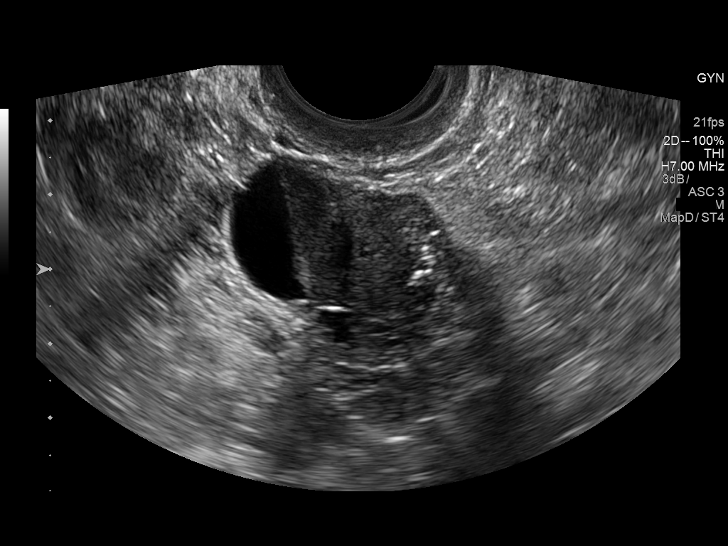
[im 90/99]
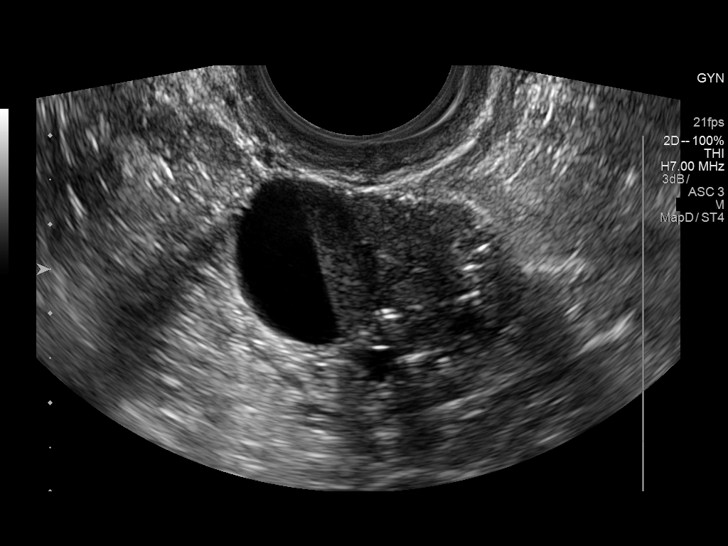
[im 99/99]
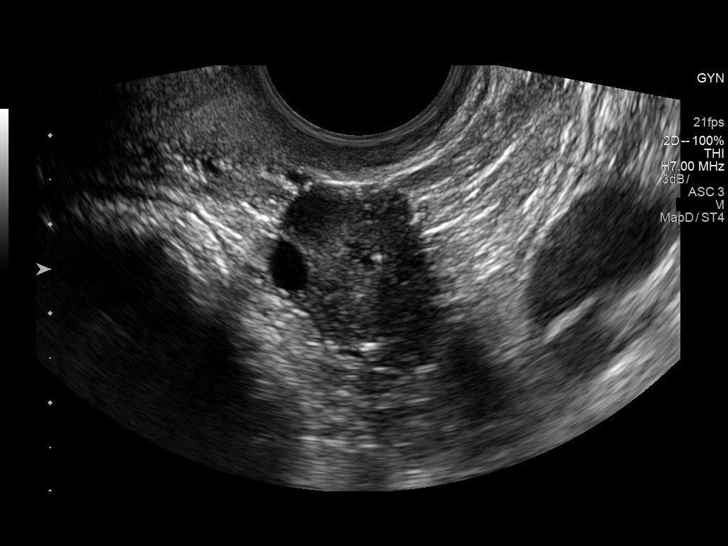

[13 of 25 positions shown; findings below may reference images not displayed]

FINDINGS: Uterus

Measurements: 7.4 x 3.0 x 4.3 cm.. Nabothian cysts are noted. Hyper
and hypoechoic lesions are noted within the uterus likely
representing fibroids. The largest of these measures 2.1 cm in
greatest dimension.

Endometrium

Thickness: 7 mm.  No focal abnormality visualized.

Right ovary

Measurements: 3.3 x 1.8 x 2.3 cm.. Cystic lesion is noted within the
right ovary measuring 1.8 cm. This is simple in appearance and
stable from the prior exam. The echogenic focus within the right
ovary seen on the prior exam is less well appreciated on the current
study.

Left ovary

Measurements: 3.3 x 2.1 x 2.3 cm.. The simple cystic lesion seen
previously is again identified and measures 1.9 x 1.6 x 1.7 cm. It
demonstrates a a area of increased echogenicity within which may
represent a hemorrhagic cyst. The smaller lesion seen on the prior
ultrasound examination is less well appreciated.

Other findings

No abnormal free fluid.
IMPRESSION: Changes consistent with uterine fibroids.

Simple cysts in the right ovary. The previously seen ovarian lesion
is not well appreciated on this study.

Complicated cyst in the left ovary likely representing a hemorrhagic
cyst. The previously seen hyperechoic lesion in the ovary is not as
well appreciated on the current study.

Follow-up examination in 6-12 months is recommended to assess for
stability.

## 2018-02-08 LAB — BASIC METABOLIC PANEL
BUN: 19 (ref 4–21)
Creatinine: 0.8 (ref <=1.1)
GLUCOSE: 90
POTASSIUM: 5.3 (ref 3.4–5.3)
SODIUM: 142 (ref 137–147)

## 2018-02-08 LAB — HEMOGLOBIN A1C: HEMOGLOBIN A1C: 5.9

## 2018-02-08 LAB — LIPID PANEL
Cholesterol: 162 (ref 0–200)
HDL: 51 (ref 35–70)
LDL Cholesterol: 71
Triglycerides: 198 — AB (ref 40–160)

## 2018-02-08 LAB — CBC AND DIFFERENTIAL
Neutrophils Absolute: 5
Platelets: 278 (ref 150–399)
WBC: 7.8

## 2018-02-08 LAB — HEPATIC FUNCTION PANEL
ALT: 18 (ref 7–35)
AST: 23 (ref 13–35)
Alkaline Phosphatase: 85 (ref 25–125)

## 2018-02-08 LAB — TSH: TSH: 2.44 (ref <=5.90)

## 2018-02-09 ENCOUNTER — Other Ambulatory Visit: Payer: Self-pay | Admitting: Obstetrics & Gynecology

## 2018-02-09 DIAGNOSIS — R928 Other abnormal and inconclusive findings on diagnostic imaging of breast: Secondary | ICD-10-CM

## 2018-02-12 ENCOUNTER — Ambulatory Visit
Admission: RE | Admit: 2018-02-12 | Discharge: 2018-02-12 | Disposition: A | Discharge status: Home / Self Care | Payer: 59 | Source: Ambulatory Visit | Attending: Obstetrics & Gynecology | Admitting: Obstetrics & Gynecology

## 2018-02-12 DIAGNOSIS — R928 Other abnormal and inconclusive findings on diagnostic imaging of breast: Secondary | ICD-10-CM

## 2018-03-10 ENCOUNTER — Encounter: Payer: Self-pay | Admitting: Emergency Medicine

## 2018-03-19 ENCOUNTER — Other Ambulatory Visit: Payer: Self-pay | Admitting: Physician Assistant

## 2018-08-11 ENCOUNTER — Other Ambulatory Visit: Payer: Self-pay | Admitting: Cardiovascular Disease

## 2018-08-17 ENCOUNTER — Telehealth: Payer: Self-pay | Admitting: Cardiovascular Disease

## 2018-08-17 NOTE — Telephone Encounter (Signed)
Patient set up for MyChart?  Yes - consent sent through [place] and received verbal   Is patient using Smartphone/computer/tablet? iphone  Did audio/video work? Did not test using doximity for visit  Does patient need telephone visit?no  Best phone number to use? [phone]  Special Instructions?  Patient is aware to have medication list, bp and weight at time of visit.  Received verbal consent 08/17/18 and sent consent form through Mychart as well.

## 2018-08-23 ENCOUNTER — Other Ambulatory Visit: Payer: Self-pay

## 2018-08-23 ENCOUNTER — Telehealth (INDEPENDENT_AMBULATORY_CARE_PROVIDER_SITE_OTHER): Payer: 59 | Admitting: Cardiovascular Disease

## 2018-08-23 ENCOUNTER — Encounter: Payer: Self-pay | Admitting: Cardiovascular Disease

## 2018-08-23 VITALS — BP 125/83 | Ht 67.0 in | Wt 166.0 lb

## 2018-08-23 DIAGNOSIS — I2510 Atherosclerotic heart disease of native coronary artery without angina pectoris: Secondary | ICD-10-CM | POA: Diagnosis not present

## 2018-08-23 DIAGNOSIS — E782 Mixed hyperlipidemia: Secondary | ICD-10-CM | POA: Diagnosis not present

## 2018-08-23 DIAGNOSIS — I119 Hypertensive heart disease without heart failure: Secondary | ICD-10-CM

## 2018-08-23 DIAGNOSIS — I10 Essential (primary) hypertension: Secondary | ICD-10-CM

## 2018-08-23 NOTE — Progress Notes (Signed)
Virtual Visit via Video Note   This visit type was conducted due to national recommendations for restrictions regarding the COVID-19 Pandemic (e.g. social distancing) in an effort to limit this patient's exposure and mitigate transmission in our community.  Due to her co-morbid illnesses, this patient is at least at moderate risk for complications without adequate follow up.  This format is felt to be most appropriate for this patient at this time.  All issues noted in this document were discussed and addressed.  A limited physical exam was performed with this format.  Please refer to the patient's chart for her consent to telehealth for [facility].   Evaluation Performed:  Follow-up visit  Date:  08/23/2018   ID:  Shelby Fitzpatrick, DOB 1965-07-16, MRN [id number]  Patient Location: Home Provider Location: Home  PCP:  [name], PA-C  Cardiologist:  Sherren Mocha, MD  Electrophysiologist:  None   Chief Complaint:  CAD  History of Present Illness:    Shelby Fitzpatrick is a 53 y.o. female with history of coronary artery disease, initially presenting with inferior wall MI in 2011 treated with a drug-eluting stent in the right coronary artery.  She underwent repeat heart catheterization in 2017 demonstrating stent patency and no other significant CAD.  Over the past year she has developed some issues with anxiety.  She takes Xanax as needed and uses it sparingly.  She otherwise is doing well.  She has not been active since the COVID-19 pandemic has started.  She has been working from home. Today, she denies symptoms of palpitations, chest pain, shortness of breath, orthopnea, PND, lower extremity edema, dizziness, or syncope.  The patient does not have symptoms concerning for COVID-19 infection (fever, chills, cough, or new shortness of breath).    Past Medical History:  Diagnosis Date  . CAD (coronary artery disease)    coronary intervention and drug-eluting stent placement  STEMI-2011   Cath 02/25/2010:  100% RCA (PCI with Promus DES); minor LAD and CFX (nothing more than 20%)  EF 65% at cath 02/25/2010  . Ectopic pregnancy    (ruptured)  . GERD (gastroesophageal reflux disease)   . Hyperlipemia   . Hypertension   . Myocardial infarction [facility])    Past Surgical History:  Procedure Laterality Date  . ANKLE SURGERY     broken and had surgery to reapair  . APPENDECTOMY     at age 9  . CARDIAC CATHETERIZATION N/A 05/29/2015   Procedure: Left Heart Cath and Coronary Angiography;  Surgeon: Jettie Booze, MD;  Location: [place] [facility];  Service: Cardiovascular;  Laterality: N/A;  . coronary intervention      drug-eluting stent placement-2011  . tubal pregnancy x2     cant recall year     Current Meds  Medication Sig  . ALPRAZolam (XANAX) 0.25 MG tablet Take 1 tablet by mouth as needed.  [name] Kitchen aspirin 81 MG tablet Take 1 tablet (81 mg total) by mouth daily.  [name] Kitchen atorvastatin (LIPITOR) 20 MG tablet TAKE 1 TABLET(20 MG) BY MOUTH DAILY  . cyclobenzaprine (FLEXERIL) 10 MG tablet Take 10 mg by mouth 3 times/day as needed-between meals & bedtime for muscle spasms (lower back).  . esomeprazole (NEXIUM) 40 MG capsule Take 1 capsule (40 mg total) by mouth daily at 12 noon.  . metoprolol tartrate (LOPRESSOR) 25 MG tablet TAKE 1 TABLET(25 MG) BY MOUTH TWICE DAILY  . nitroGLYCERIN (NITROSTAT) 0.4 MG SL tablet Place 1 tablet (0.4 mg total) under the tongue  every 5 (five) minutes as needed for chest pain.     Allergies:   Biaxin [clarithromycin] and Penicillins   Social History   Tobacco Use  . Smoking status: Current Every Day Smoker    Packs/day: 0.50    Years: 35.00    Pack years: 17.50    Types: Cigarettes  . Smokeless tobacco: Never Used  Substance Use Topics  . Alcohol use: Yes    Alcohol/week: 0.0 standard drinks    Comment: rare  . Drug use: No     Family Hx: The patient's family history includes Colon polyps in her sister and sister; Coronary  artery disease in her father; Heart attack in her father; Hypertension in her father, mother, and sister; Stroke in her father. There is no history of Colon cancer, Stomach cancer, Rectal cancer, Esophageal cancer, or Liver cancer.  ROS:   Please see the history of present illness.    All other systems reviewed and are negative.  Labs/Other Tests and Data Reviewed:    EKG:  An ECG dated 09/30/2017 was personally reviewed today and demonstrated:  Normal sinus rhythm 62 bpm, nonspecific T wave abnormality.  Recent Labs: 02/08/2018: ALT 18; BUN 19; Creatinine 0.8; Platelets 278; Potassium 5.3; Sodium 142; TSH 2.44   Recent Lipid Panel Lab Results  Component Value Date/Time   CHOL 162 02/08/2018   CHOL 132 08/03/2017 07:58 AM   TRIG 198 (A) 02/08/2018   HDL 51 02/08/2018   HDL 57 08/03/2017 07:58 AM   CHOLHDL 2.3 08/03/2017 07:58 AM   CHOLHDL 2.5 07/20/2015 08:14 AM   LDLCALC 71 02/08/2018   LDLCALC 50 08/03/2017 07:58 AM    Wt Readings from Last 3 Encounters:  08/23/18 166 lb (75.3 kg)  09/30/17 166 lb (75.3 kg)  07/31/17 165 lb 12.8 oz (75.2 kg)     Objective:    Vital Signs:  BP 125/83 (BP Location: Right Arm, Patient Position: Sitting, Cuff Size: Normal)   Ht 5\' 7"  (1.702 m)   Wt 166 lb (75.3 kg)   LMP 02/24/2013   BMI 26.00 kg/m    VITAL SIGNS:  reviewed The patient is alert, oriented, in no distress.  Remaining exam not performed as this is a video conference visit  ASSESSMENT & PLAN:    1. Coronary artery disease, native vessel, without angina: The patient's medications are reviewed.  She is on appropriate therapy with aspirin, a statin drug, and a beta-blocker.  She did have an episode of chest pain about 1 year ago that resolved after starting a PPI.  An exercise treadmill was recommended but she did not follow through because her symptoms went away with use of a PPI. 2. Hypertension: Blood pressure controlled on a single agent (beta-blocker) 3. Mixed  hyperlipidemia: Lipids reviewed and at goal as above with an LDL cholesterol of 71 mg/dL.  Triglycerides were high compared to her baseline levels.  I would attribute this to weight gain.  Lifestyle modification is discussed with the patient.  COVID-19 Education: The signs and symptoms of COVID-19 were discussed with the patient and how to seek care for testing (follow up with PCP or arrange E-visit).  The importance of social distancing was discussed today.  Time:   Today, I have spent 12 minutes with the patient with telehealth technology discussing the above problems.     Medication Adjustments/Labs and Tests Ordered: Current medicines are reviewed at length with the patient today.  Concerns regarding medicines are outlined above.   Tests Ordered:  No orders of the defined types were placed in this encounter.   Medication Changes: No orders of the defined types were placed in this encounter.   Disposition:  Follow up in 1 year(s)  Signed, Sherren Mocha, MD  08/23/2018 9:36 AM    [place]

## 2018-08-23 NOTE — Patient Instructions (Signed)

## 2018-09-03 ENCOUNTER — Encounter: Payer: Self-pay | Admitting: Emergency Medicine

## 2018-09-10 ENCOUNTER — Other Ambulatory Visit: Payer: Self-pay | Admitting: Cardiovascular Disease

## 2018-09-20 ENCOUNTER — Encounter: Payer: Self-pay | Admitting: Physician Assistant

## 2018-09-21 ENCOUNTER — Other Ambulatory Visit: Payer: Self-pay

## 2018-09-21 ENCOUNTER — Encounter: Payer: Self-pay | Admitting: Physician Assistant

## 2018-09-21 ENCOUNTER — Ambulatory Visit: Payer: 59 | Admitting: Physician Assistant

## 2018-09-21 VITALS — BP 130/84 | HR 68 | Temp 97.3°F | Resp 16 | Ht 67.0 in | Wt 171.0 lb

## 2018-09-21 DIAGNOSIS — H6123 Impacted cerumen, bilateral: Secondary | ICD-10-CM | POA: Diagnosis not present

## 2018-09-21 DIAGNOSIS — H6982 Other specified disorders of Eustachian tube, left ear: Secondary | ICD-10-CM

## 2018-09-21 NOTE — Patient Instructions (Signed)
The ear canals are completely clear now! See home instructions below for ear wax buildup. If you note a recurrence of these symptoms, please come see [place] as periodic flushes may be needed.  Start a daily Xyzal and continue use of Flonase to help with fluid buildup behind the eardrums.  Let me know if symptoms are not resolving!   Earwax Buildup, Adult The ears produce a substance called earwax that helps keep bacteria out of the ear and protects the skin in the ear canal. Occasionally, earwax can build up in the ear and cause discomfort or hearing loss. What increases the risk? This condition is more likely to develop in people who:  Are female.  Are elderly.  Naturally produce more earwax.  Clean their ears often with cotton swabs.  Use earplugs often.  Use in-ear headphones often.  Wear hearing aids.  Have narrow ear canals.  Have earwax that is overly thick or sticky.  Have eczema.  Are dehydrated.  Have excess hair in the ear canal. What are the signs or symptoms? Symptoms of this condition include:  Reduced or muffled hearing.  A feeling of fullness in the ear or feeling that the ear is plugged.  Fluid coming from the ear.  Ear pain.  Ear itch.  Ringing in the ear.  Coughing.  An obvious piece of earwax that can be seen inside the ear canal. How is this diagnosed? This condition may be diagnosed based on:  Your symptoms.  Your medical history.  An ear exam. During the exam, your health care provider will look into your ear with an instrument called an otoscope. You may have tests, including a hearing test. How is this treated? This condition may be treated by:  Using ear drops to soften the earwax.  Having the earwax removed by a health care provider. The health care provider may: ? Flush the ear with water. ? Use an instrument that has a loop on the end (curette). ? Use a suction device.  Surgery to remove the wax buildup. This may be done  in severe cases. Follow these instructions at home:   Take over-the-counter and prescription medicines only as told by your health care provider.  Do not put any objects, including cotton swabs, into your ear. You can clean the opening of your ear canal with a washcloth or facial tissue.  Follow instructions from your health care provider about cleaning your ears. Do not over-clean your ears.  Drink enough fluid to keep your urine clear or pale yellow. This will help to thin the earwax.  Keep all follow-up visits as told by your health care provider. If earwax builds up in your ears often or if you use hearing aids, consider seeing your health care provider for routine, preventive ear cleanings. Ask your health care provider how often you should schedule your cleanings.  If you have hearing aids, clean them according to instructions from the manufacturer and your health care provider. Contact a health care provider if:  You have ear pain.  You develop a fever.  You have blood, pus, or other fluid coming from your ear.  You have hearing loss.  You have ringing in your ears that does not go away.  Your symptoms do not improve with treatment.  You feel like the room is spinning (vertigo). Summary  Earwax can build up in the ear and cause discomfort or hearing loss.  The most common symptoms of this condition include reduced or muffled hearing  and a feeling of fullness in the ear or feeling that the ear is plugged.  This condition may be diagnosed based on your symptoms, your medical history, and an ear exam.  This condition may be treated by using ear drops to soften the earwax or by having the earwax removed by a health care provider.  Do not put any objects, including cotton swabs, into your ear. You can clean the opening of your ear canal with a washcloth or facial tissue. This information is not intended to replace advice given to you by your health care provider. Make sure  you discuss any questions you have with your health care provider. Document Released: 05/29/2004 Document Revised: 04/02/2017 Document Reviewed: 07/02/2016 Elsevier Interactive Patient Education  2019 [employer].

## 2018-09-21 NOTE — Progress Notes (Signed)
Patient presents to clinic today c/o 3 weeks of clogged sensation in ears with intermittent discomfort. Denies ear swelling, ear drainage, sinus pressure, sinus pain, cough. Denies fever or chills. Denies change in hearing. Has chronic mild tinnitus but denies any change. Has been wearing headsets at work daily over the past couple of months and notes she has been getting a lot of wax on the end of her qtip.   Past Medical History:  Diagnosis Date  . CAD (coronary artery disease)    coronary intervention and drug-eluting stent placement STEMI-2011   Cath 02/25/2010:  100% RCA (PCI with Promus DES); minor LAD and CFX (nothing more than 20%)  EF 65% at cath 02/25/2010  . Ectopic pregnancy    (ruptured)  . GERD (gastroesophageal reflux disease)   . Hyperlipemia   . Hypertension   . Myocardial infarction [facility])     Current Outpatient Medications on File Prior to Visit  Medication Sig Dispense Refill  . ALPRAZolam (XANAX) 0.25 MG tablet Take 1 tablet by mouth as needed.    [name] Kitchen aspirin 81 MG tablet Take 1 tablet (81 mg total) by mouth daily.    [name] Kitchen atorvastatin (LIPITOR) 20 MG tablet TAKE 1 TABLET(20 MG) BY MOUTH DAILY 90 tablet 0  . cyclobenzaprine (FLEXERIL) 10 MG tablet Take 10 mg by mouth 3 times/day as needed-between meals & bedtime for muscle spasms (lower back).    . esomeprazole (NEXIUM) 40 MG capsule Take 1 capsule (40 mg total) by mouth daily at 12 noon. 90 capsule 3  . metoprolol tartrate (LOPRESSOR) 25 MG tablet TAKE 1 TABLET(25 MG) BY MOUTH TWICE DAILY 60 tablet 11  . nitroGLYCERIN (NITROSTAT) 0.4 MG SL tablet Place 1 tablet (0.4 mg total) under the tongue every 5 (five) minutes as needed for chest pain. 25 tablet 3   No current facility-administered medications on file prior to visit.     Allergies  Allergen Reactions  . Biaxin [Clarithromycin] Nausea Only  . Penicillins Rash    Family History  Problem Relation Age of Onset  . Coronary artery disease Father   . Heart attack  Father   . Hypertension Father   . Stroke Father   . Hypertension Mother   . Hypertension Sister   . Colon polyps Sister   . Colon polyps Sister   . Colon cancer Neg Hx   . Stomach cancer Neg Hx   . Rectal cancer Neg Hx   . Esophageal cancer Neg Hx   . Liver cancer Neg Hx     Social History   Socioeconomic History  . Marital status: Married    Spouse name: Not on file  . Number of children: 1  . Years of education: Not on file  . Highest education level: Not on file  Occupational History  . Occupation: Dentist  Social Needs  . Financial resource strain: Not on file  . Food insecurity:    Worry: Not on file    Inability: Not on file  . Transportation needs:    Medical: Not on file    Non-medical: Not on file  Tobacco Use  . Smoking status: Current Every Day Smoker    Packs/day: 0.50    Years: 35.00    Pack years: 17.50    Types: Cigarettes  . Smokeless tobacco: Never Used  Substance and Sexual Activity  . Alcohol use: Yes    Alcohol/week: 0.0 standard drinks    Comment: rare  . Drug use: No  . Sexual  activity: Not on file  Lifestyle  . Physical activity:    Days per week: Not on file    Minutes per session: Not on file  . Stress: Not on file  Relationships  . Social connections:    Talks on phone: Not on file    Gets together: Not on file    Attends religious service: Not on file    Active member of club or organization: Not on file    Attends meetings of clubs or organizations: Not on file    Relationship status: Not on file  Other Topics Concern  . Not on file  Social History Narrative  . Not on file   Review of Systems - See HPI.  All other ROS are negative.  BP 130/84   Pulse 68   Temp (!) 97.3 F (36.3 C) (Skin)   Resp 16   Ht 5\' 7"  (1.702 m)   Wt 171 lb (77.6 kg)   LMP 02/24/2013   SpO2 98%   BMI 26.78 kg/m   Physical Exam Vitals signs reviewed.  Constitutional:      Appearance: Normal appearance.  HENT:     Head:  Normocephalic and atraumatic.     Right Ear: Tympanic membrane normal.     Left Ear: Tympanic membrane normal.     Ears:     Comments: Cerumen impaction noted bilaterally on initial examination. On repeat exam after impaction removal some mild serous fluid noted behind L TM.     Nose: Nose normal.     Mouth/Throat:     Mouth: Mucous membranes are moist.  Eyes:     Conjunctiva/sclera: Conjunctivae normal.  Neck:     Musculoskeletal: Neck supple.  Pulmonary:     Effort: Pulmonary effort is normal.     Breath sounds: Normal breath sounds.  Lymphadenopathy:     Cervical: No cervical adenopathy.  Neurological:     General: No focal deficit present.     Mental Status: She is alert and oriented to person, place, and time.    Assessment/Plan: 1. Bilateral impacted cerumen Unable to remove manually. Removed via lavage x 2. Home care discussed.  2. Dysfunction of left eustachian tube Continue Flonase. Start daily Xyzal. Avoiding decongestants due to cardiac history.   Leeanne Rio, PA-C

## 2018-10-08 ENCOUNTER — Other Ambulatory Visit: Payer: Self-pay | Admitting: Physician Assistant

## 2018-10-08 NOTE — Telephone Encounter (Signed)
This is not really a medication that should be taken chronically unless absolutely necessary.  She has seen GI in the past.  I would prefer she get refills from her PCP or GI for this drug. Richardson Dopp, PA-C    10/08/2018 1:42 PM

## 2018-12-07 ENCOUNTER — Other Ambulatory Visit: Payer: Self-pay | Admitting: Cardiovascular Disease

## 2019-02-14 ENCOUNTER — Other Ambulatory Visit: Payer: Self-pay

## 2019-02-14 ENCOUNTER — Encounter (HOSPITAL_BASED_OUTPATIENT_CLINIC_OR_DEPARTMENT_OTHER): Payer: Self-pay

## 2019-02-14 ENCOUNTER — Emergency Department (HOSPITAL_BASED_OUTPATIENT_CLINIC_OR_DEPARTMENT_OTHER)
Admission: EM | Admit: 2019-02-14 | Discharge: 2019-02-14 | Disposition: A | Discharge status: Home / Self Care | Payer: 59 | Attending: Emergency Medicine | Admitting: Emergency Medicine

## 2019-02-14 ENCOUNTER — Emergency Department (HOSPITAL_BASED_OUTPATIENT_CLINIC_OR_DEPARTMENT_OTHER): Payer: 59

## 2019-02-14 ENCOUNTER — Encounter: Payer: Self-pay | Admitting: Physician Assistant

## 2019-02-14 DIAGNOSIS — I25118 Atherosclerotic heart disease of native coronary artery with other forms of angina pectoris: Secondary | ICD-10-CM | POA: Diagnosis not present

## 2019-02-14 DIAGNOSIS — I252 Old myocardial infarction: Secondary | ICD-10-CM | POA: Diagnosis not present

## 2019-02-14 DIAGNOSIS — Z7982 Long term (current) use of aspirin: Secondary | ICD-10-CM | POA: Diagnosis not present

## 2019-02-14 DIAGNOSIS — Z79899 Other long term (current) drug therapy: Secondary | ICD-10-CM | POA: Diagnosis not present

## 2019-02-14 DIAGNOSIS — F17210 Nicotine dependence, cigarettes, uncomplicated: Secondary | ICD-10-CM | POA: Diagnosis not present

## 2019-02-14 DIAGNOSIS — R5383 Other fatigue: Secondary | ICD-10-CM | POA: Diagnosis not present

## 2019-02-14 DIAGNOSIS — I10 Essential (primary) hypertension: Secondary | ICD-10-CM | POA: Insufficient documentation

## 2019-02-14 DIAGNOSIS — G4485 Primary stabbing headache: Secondary | ICD-10-CM | POA: Diagnosis not present

## 2019-02-14 NOTE — ED Notes (Signed)
Patient transported to CT 

## 2019-02-14 NOTE — ED Triage Notes (Signed)
Pt c/o fatigue and diarrhea started 10/8-on 10/9 had vision changes and slight HA-vision changes lasted "couple minutes"-diarrhea ended-fatigue continued-denies fever-NAD-steady gait

## 2019-02-14 NOTE — ED Notes (Signed)
ED Provider at bedside. 

## 2019-02-14 NOTE — ED Notes (Signed)
Pt verbalized understanding of dc instructions.

## 2019-02-14 NOTE — ED Provider Notes (Signed)
[facility] EMERGENCY DEPARTMENT Provider Note   CSN: [id number] Arrival date & time: 02/14/19  1128     History   Chief Complaint Chief Complaint  Patient presents with  . Fatigue    HPI Shelby Fitzpatrick is a 53 y.o. female.     Patient is a 53 year old female with a history of MI status post stenting, hypertension, hyperlipidemia and prior migraines who is presenting today with several symptoms.  Patient states that Thursday she had about 5 episodes of diarrhea and generalized fatigue.  The diarrhea resolved but Friday on her way to work she had an episode of flashing lights and some loss of vision in her right peripheral visual field that lasted no more than 1 to 2 minutes and then resolved.  Later that day she started developing sharp stabbing-like pains in the right side of her head in the occiput area.  Saturday the fatigue continued as well as the pain in her head and on Sunday.  Now she states the pain is almost completely resolved but she occasionally has twinges of the pain.  She also still feels fatigued.  No cough, congestion, vomiting, fever.  No neck pain, eye pain and she denies any visual loss.  She states her vision is normal and has been since that day.  In the past she has had 1 complicated migraine when she was in her 38s that cause similar symptoms but none since.  She denies any head trauma, unilateral weakness, numbness, speech difficulty or dizziness.  No tick exposure. she spoke with her doctor today who recommended she come here for further evaluation.  The history is provided by the patient.    Past Medical History:  Diagnosis Date  . CAD (coronary artery disease)    coronary intervention and drug-eluting stent placement STEMI-2011   Cath 02/25/2010:  100% RCA (PCI with Promus DES); minor LAD and CFX (nothing more than 20%)  EF 65% at cath 02/25/2010  . Ectopic pregnancy    (ruptured)  . GERD (gastroesophageal reflux disease)   . Hyperlipemia   .  Hypertension   . Myocardial infarction [facility])     Patient Active Problem List   Diagnosis Date Noted  . Essential hypertension 09/29/2017  . Constipation 05/09/2016  . Abdominal pain, epigastric 05/09/2016  . NSAID long-term use 05/09/2016  . Nausea without vomiting 05/09/2016  . Angina pectoris ([place])   . Nicotine addiction 01/18/2012  . Hyperlipidemia 03/13/2010  . 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 CAD (coronary artery disease) 03/13/2010    Past Surgical History:  Procedure Laterality Date  . ANKLE SURGERY     broken and had surgery to reapair  . APPENDECTOMY     at age 53  . CARDIAC CATHETERIZATION N/A 05/29/2015   Procedure: Left Heart Cath and Coronary Angiography;  Surgeon: Jettie Booze, MD;  Location: [facility];  Service: Cardiovascular;  Laterality: N/A;  . coronary intervention      drug-eluting stent placement-2011  . tubal pregnancy x2     cant recall year     OB History   No obstetric history on file.      Home Medications    Prior to Admission medications   Medication Sig Start Date End Date Taking? Authorizing Provider  ALPRAZolam Duanne Moron) 0.25 MG tablet Take 1 tablet by mouth as needed.    [provider]  aspirin 81 MG tablet Take 1 tablet (81 mg total) by mouth daily. 07/29/10   Burt Knack,  Legrand Como, MD  atorvastatin (LIPITOR) 20 MG tablet TAKE 1 TABLET(20 MG) BY MOUTH DAILY 12/07/18   Sherren Mocha, MD  cyclobenzaprine (FLEXERIL) 10 MG tablet Take 10 mg by mouth 3 times/day as needed-between meals & bedtime for muscle spasms (lower back).    [provider]  esomeprazole (NEXIUM) 40 MG capsule Take 1 capsule (40 mg total) by mouth daily at 12 noon. 09/30/17   Richardson Dopp T, PA-C  metoprolol tartrate (LOPRESSOR) 25 MG tablet TAKE 1 TABLET(25 MG) BY MOUTH TWICE DAILY 08/11/18   Sherren Mocha, MD  nitroGLYCERIN (NITROSTAT) 0.4 MG SL tablet Place 1 tablet (0.4 mg total) under the tongue every 5 (five) minutes as  needed for chest pain. 11/16/17   Sherren Mocha, MD    Family History Family History  Problem Relation Age of Onset  . Coronary artery disease Father   . Heart attack Father   . Hypertension Father   . Stroke Father   . Hypertension Mother   . Hypertension Sister   . Colon polyps Sister   . Colon polyps Sister   . Colon cancer Neg Hx   . Stomach cancer Neg Hx   . Rectal cancer Neg Hx   . Esophageal cancer Neg Hx   . Liver cancer Neg Hx     Social History Social History   Tobacco Use  . Smoking status: Current Every Day Smoker    Packs/day: 0.50    Years: 35.00    Pack years: 17.50    Types: Cigarettes  . Smokeless tobacco: Never Used  Substance Use Topics  . Alcohol use: Yes    Alcohol/week: 0.0 standard drinks    Comment: rare  . Drug use: No     Allergies   Biaxin [clarithromycin] and Penicillins   Review of Systems Review of Systems  All other systems reviewed and are negative.    Physical Exam Updated Vital Signs BP (!) 175/93 (BP Location: Left Arm)   Pulse 83   Temp 98.4 F (36.9 C) (Oral)   Resp 18   Ht 5\' 7"  (1.702 m)   Wt 75.3 kg   LMP 02/24/2013   SpO2 99%   BMI 26.00 kg/m   Physical Exam Vitals signs and nursing note reviewed.  Constitutional:      General: She is not in acute distress.    Appearance: Normal appearance. She is well-developed and normal weight.  HENT:     Head: Normocephalic and atraumatic.     Right Ear: Tympanic membrane normal.     Left Ear: Tympanic membrane normal.     Nose: Nose normal.     Mouth/Throat:     Mouth: Mucous membranes are moist.  Eyes:     Extraocular Movements: Extraocular movements intact.     Conjunctiva/sclera: Conjunctivae normal.     Pupils: Pupils are equal, round, and reactive to light.  Cardiovascular:     Rate and Rhythm: Normal rate and regular rhythm.     Heart sounds: Normal heart sounds. No murmur. No friction rub.  Pulmonary:     Effort: Pulmonary effort is normal.      Breath sounds: Normal breath sounds. No wheezing or rales.  Abdominal:     General: Bowel sounds are normal. There is no distension.     Palpations: Abdomen is soft.     Tenderness: There is no abdominal tenderness. There is no guarding or rebound.  Musculoskeletal: Normal range of motion.        General: No tenderness.  Comments: No edema  Skin:    General: Skin is warm and dry.     Findings: No rash.  Neurological:     General: No focal deficit present.     Mental Status: She is alert and oriented to person, place, and time. Mental status is at baseline.     Cranial Nerves: No cranial nerve deficit.     Sensory: No sensory deficit.     Motor: No weakness.     Gait: Gait normal.     Comments: No visual field cuts  Psychiatric:        Mood and Affect: Mood normal.        Behavior: Behavior normal.        Thought Content: Thought content normal.      ED Treatments / Results  Labs (all labs ordered are listed, but only abnormal results are displayed) Labs Reviewed - No data to display  EKG None  Radiology Ct Head Wo Contrast  Result Date: 02/14/2019 CLINICAL DATA:  Fatigue, vision changes. EXAM: CT HEAD WITHOUT CONTRAST TECHNIQUE: Contiguous axial images were obtained from the base of the skull through the vertex without intravenous contrast. COMPARISON:  None. FINDINGS: Brain: Lung bases are clear.  No effusions.  Heart is normal size. Vascular: No focal hepatic abnormality.  Gallbladder unremarkable. Skull: No acute calvarial abnormality. Sinuses/Orbits: Visualized paranasal sinuses and mastoids clear. Orbital soft tissues unremarkable. Other: None IMPRESSION: No acute intracranial abnormality. Electronically Signed   By: Rolm Baptise M.D.   On: 02/14/2019 12:24    Procedures Procedures (including critical care time)  Medications Ordered in ED Medications - No data to display   Initial Impression / Assessment and Plan / ED Course  I have reviewed the triage vital  signs and the nursing notes.  Pertinent labs & imaging results that were available during my care of the patient were reviewed by me and considered in my medical decision making (see chart for details).        Patient presenting with vague symptoms of diarrhea, fatigue and atypical headache.  This is been within the last 4 days.  The diarrhea has resolved but she continues to have fatigue as well as improving headache in the right occiput area.  Patient had a short episode of flashing lights in visual loss in her peripheral visual field on Friday that only lasted for a few minutes and resolved.  This is not came back and she denies any blurry vision, loss of vision currently or eye pain.  Patient exam is within normal limits.  No evidence of shingles.  No neck pain or cervical/clavicular adenopathy.  No symptoms concerning for meningitis, [facility] Spotted fever, stroke or eye issue.  Suspect atypical migraine presentation.  Also concern for underlying etiology such as a viral illness.  Patient does not wish to be tested for COVID at this time.    1:00 PM CT neg for acute pathology.  Repeat BP 144/86.  Pt given reassurance and follow up with PCP if not back to normal in 1 week  Final Clinical Impressions(s) / ED Diagnoses   Final diagnoses:  Fatigue, unspecified type  Primary stabbing headache    ED Discharge Orders    None       Blanchie Dessert, MD 02/14/19 1302

## 2019-02-14 NOTE — Discharge Instructions (Signed)
Most likely the way you are feeling the way you are is because you are fighting off a bug.  However if you have any development of worsening headache, visual loss, one-sided weakness or numbness please return to the emergency room immediately.

## 2019-03-07 LAB — BASIC METABOLIC PANEL
BUN: 20 (ref 4–21)
Creatinine: 0.8 (ref 0.5–1.1)
Glucose: 91
Potassium: 5 (ref 3.4–5.3)
Sodium: 143 (ref 137–147)

## 2019-03-07 LAB — LIPID PANEL
Cholesterol: 140 (ref 0–200)
HDL: 48 (ref 35–70)
LDL Cholesterol: 75
Triglycerides: 147 (ref 40–160)

## 2019-03-07 LAB — HEPATIC FUNCTION PANEL
ALT: 17 (ref 7–35)
AST: 16 (ref 13–35)

## 2019-03-07 LAB — TSH: TSH: 2.15 (ref 0.41–5.90)

## 2019-03-07 LAB — HM MAMMOGRAPHY

## 2019-03-07 LAB — CBC AND DIFFERENTIAL
Platelets: 250 (ref 150–399)
WBC: 7.2

## 2019-03-07 LAB — VITAMIN D 25 HYDROXY (VIT D DEFICIENCY, FRACTURES): Vit D, 25-Hydroxy: 97.3

## 2019-03-07 LAB — HEMOGLOBIN A1C: Hemoglobin A1C: 5

## 2019-03-09 ENCOUNTER — Encounter: Payer: Self-pay | Admitting: Physician Assistant

## 2019-04-12 ENCOUNTER — Encounter: Payer: Self-pay | Admitting: Obstetrics & Gynecology

## 2019-05-26 ENCOUNTER — Encounter: Payer: Self-pay | Admitting: Gastroenterology

## 2019-06-29 ENCOUNTER — Encounter: Payer: Self-pay | Admitting: Gastroenterology

## 2019-07-07 ENCOUNTER — Telehealth: Payer: Self-pay | Admitting: Cardiovascular Disease

## 2019-07-07 DIAGNOSIS — I2510 Atherosclerotic heart disease of native coronary artery without angina pectoris: Secondary | ICD-10-CM

## 2019-07-07 DIAGNOSIS — E782 Mixed hyperlipidemia: Secondary | ICD-10-CM

## 2019-07-07 NOTE — Telephone Encounter (Signed)
Patient states she is requesting orders for blood work to be completed on the same day as her appointment scheduled for 08/23/19 at 8:15 AM with Richardson Dopp.

## 2019-07-07 NOTE — Telephone Encounter (Signed)
Will you be ordering labs at office visit?

## 2019-07-08 NOTE — Telephone Encounter (Signed)
She will need fasting CMET, Lipids. Order has been placed in [facility]. Richardson Dopp, PA-C    07/08/2019 12:27 PM

## 2019-07-08 NOTE — Telephone Encounter (Signed)
I called and spoke with patient, she is aware to be fasting at appointment on 08/23/19 with Nicki Reaper so labs can be drawn.

## 2019-07-15 ENCOUNTER — Encounter: Payer: 59 | Admitting: Gastroenterology

## 2019-07-25 ENCOUNTER — Encounter: Payer: Self-pay | Admitting: Physician Assistant

## 2019-07-25 ENCOUNTER — Other Ambulatory Visit: Payer: Self-pay

## 2019-07-25 ENCOUNTER — Ambulatory Visit (INDEPENDENT_AMBULATORY_CARE_PROVIDER_SITE_OTHER): Payer: 59 | Admitting: Physician Assistant

## 2019-07-25 VITALS — BP 130/80 | HR 72 | Temp 98.6°F | Resp 16 | Ht 67.0 in | Wt 165.0 lb

## 2019-07-25 DIAGNOSIS — R590 Localized enlarged lymph nodes: Secondary | ICD-10-CM

## 2019-07-25 LAB — CBC WITH DIFFERENTIAL/PLATELET
Basophils Absolute: 0.1 10*3/uL (ref 0.0–0.1)
Basophils Relative: 0.6 % (ref 0.0–3.0)
Eosinophils Absolute: 0.2 10*3/uL (ref 0.0–0.7)
Eosinophils Relative: 2 % (ref 0.0–5.0)
HCT: 40.8 % (ref 36.0–46.0)
Hemoglobin: 13.6 g/dL (ref 12.0–15.0)
Lymphocytes Relative: 24.9 % (ref 12.0–46.0)
Lymphs Abs: 2.2 10*3/uL (ref 0.7–4.0)
MCHC: 33.4 g/dL (ref 30.0–36.0)
MCV: 94.6 fl (ref 78.0–100.0)
Monocytes Absolute: 0.6 10*3/uL (ref 0.1–1.0)
Monocytes Relative: 6.3 % (ref 3.0–12.0)
Neutro Abs: 5.9 10*3/uL (ref 1.4–7.7)
Neutrophils Relative %: 66.2 % (ref 43.0–77.0)
Platelets: 226 10*3/uL (ref 150.0–400.0)
RBC: 4.31 Mil/uL (ref 3.87–5.11)
RDW: 13.1 % (ref 11.5–15.5)
WBC: 9 10*3/uL (ref 4.0–10.5)

## 2019-07-25 NOTE — Patient Instructions (Addendum)
Please go to the lab for blood work. I will call with results.  I want you to keep hydrated and get plenty of rest. Complete the entire course of antibiotic given by the Urgent Care provider. We will follow-up in 1 week to reassess the area.  If not continuing to improve/resolve we will proceed with an Ultrasound of the area to further assess.

## 2019-07-25 NOTE — Progress Notes (Signed)
Patient presents to clinic today c/o 1 week of swollen lymph node on his left neck.  Notes she woke up last Tuesday and felt some discomfort in her neck and jaw.  Test her neck and found it to be sore with swollen lymph node.  Will schedule for Covid vaccine the next day so she went to an urgent care to get assessment.  Was told she had an infected lymph node.  Was started on doxycycline which she has been taking twice daily as directed.  Notes improvement in swelling and tenderness.  Notes the lymph node is still present and she wanted it to be checked out.  Denies fever, chills, sweats or unexplainable weight changes.  Denies sore throat, ear pain or other URI symptoms.  Denies noted swollen lymph node elsewhere.  Past Medical History:  Diagnosis Date  . CAD (coronary artery disease)    coronary intervention and drug-eluting stent placement STEMI-2011   Cath 02/25/2010:  100% RCA (PCI with Promus DES); minor LAD and CFX (nothing more than 20%)  EF 65% at cath 02/25/2010  . Ectopic pregnancy    (ruptured)  . GERD (gastroesophageal reflux disease)   . Hyperlipemia   . Hypertension   . Myocardial infarction [facility])     Current Outpatient Medications on File Prior to Visit  Medication Sig Dispense Refill  . ALPRAZolam (XANAX) 0.25 MG tablet Take 1 tablet by mouth as needed.    [name] Kitchen aspirin 81 MG tablet Take 1 tablet (81 mg total) by mouth daily.    [name] Kitchen atorvastatin (LIPITOR) 20 MG tablet TAKE 1 TABLET(20 MG) BY MOUTH DAILY 90 tablet 3  . cyclobenzaprine (FLEXERIL) 10 MG tablet Take 10 mg by mouth 3 times/day as needed-between meals & bedtime for muscle spasms (lower back).    . doxycycline (VIBRAMYCIN) 100 MG capsule Take 100 mg by mouth 2 (two) times daily.    [name] Kitchen esomeprazole (NEXIUM) 40 MG capsule Take 1 capsule (40 mg total) by mouth daily at 12 noon. 90 capsule 3  . metoprolol tartrate (LOPRESSOR) 25 MG tablet TAKE 1 TABLET(25 MG) BY MOUTH TWICE DAILY 60 tablet 11  . nitroGLYCERIN (NITROSTAT)  0.4 MG SL tablet Place 1 tablet (0.4 mg total) under the tongue every 5 (five) minutes as needed for chest pain. 25 tablet 3   No current facility-administered medications on file prior to visit.    Allergies  Allergen Reactions  . Biaxin [Clarithromycin] Nausea Only  . Penicillins Rash    Family History  Problem Relation Age of Onset  . Coronary artery disease Father   . Heart attack Father   . Hypertension Father   . Stroke Father   . Hypertension Mother   . Hypertension Sister   . Colon polyps Sister   . Colon polyps Sister   . Colon cancer Neg Hx   . Stomach cancer Neg Hx   . Rectal cancer Neg Hx   . Esophageal cancer Neg Hx   . Liver cancer Neg Hx     Social History   Socioeconomic History  . Marital status: Married    Spouse name: Not on file  . Number of children: 1  . Years of education: Not on file  . Highest education level: Not on file  Occupational History  . Occupation: Dentist  Tobacco Use  . Smoking status: Current Every Day Smoker    Packs/day: 0.50    Years: 35.00    Pack years: 17.50    Types: Cigarettes  .  Smokeless tobacco: Never Used  Substance and Sexual Activity  . Alcohol use: Yes    Alcohol/week: 0.0 standard drinks    Comment: rare  . Drug use: No  . Sexual activity: Not on file  Other Topics Concern  . Not on file  Social History Narrative  . Not on file   Social Determinants of Health   Financial Resource Strain:   . Difficulty of Paying Living Expenses:   Food Insecurity:   . Worried About Charity fundraiser in the Last Year:   . Arboriculturist in the Last Year:   Transportation Needs:   . Film/video editor (Medical):   [name] Kitchen Lack of Transportation (Non-Medical):   Physical Activity:   . Days of Exercise per Week:   . Minutes of Exercise per Session:   Stress:   . Feeling of Stress :   Social Connections:   . Frequency of Communication with Friends and Family:   . Frequency of Social Gatherings with Friends  and Family:   . Attends Religious Services:   . Active Member of Clubs or Organizations:   . Attends Archivist Meetings:   [name] Kitchen Marital Status:    Review of Systems - See HPI.  All other ROS are negative.  BP 130/80   Pulse 72   Temp 98.6 F (37 C) (Temporal)   Resp 16   Ht 5\' 7"  (1.702 m)   Wt 165 lb (74.8 kg)   LMP 02/24/2013   SpO2 98%   BMI 25.84 kg/m   Physical Exam Vitals reviewed.  Constitutional:      Appearance: Normal appearance.  HENT:     Head: Normocephalic and atraumatic.     Right Ear: Tympanic membrane, ear canal and external ear normal. There is no impacted cerumen.     Left Ear: Ear canal and external ear normal. There is no impacted cerumen.  Eyes:     Conjunctiva/sclera: Conjunctivae normal.     Pupils: Pupils are equal, round, and reactive to light.  Cardiovascular:     Rate and Rhythm: Normal rate and regular rhythm.     Pulses: Normal pulses.     Heart sounds: Normal heart sounds.  Musculoskeletal:     Cervical back: Neck supple.  Lymphadenopathy:     Head:     Right side of head: Submandibular adenopathy present. No submental, tonsillar, preauricular, posterior auricular or occipital adenopathy.     Left side of head: No submental, submandibular, tonsillar, preauricular, posterior auricular or occipital adenopathy.     Cervical:     Right cervical: No superficial, deep or posterior cervical adenopathy.    Left cervical: No superficial, deep or posterior cervical adenopathy.  Neurological:     Mental Status: She is alert.    Assessment/Plan: 1. Cervical lymphadenopathy Solitary left anterior cervical/submental  lymph node palpated on examination about 1 x 1 cm.  Is quite firm but mobile.  Giving history and response to antibiotic will have her complete course of antibiotic.  Will check CBC with differential.  Recheck in 1 week.  If symptoms subsiding but lymph node has not improved in size and is still as firm will need ultrasound to  further assess and make sure no concern for malignancy. - CBC w/Diff   Leeanne Rio, PA-C

## 2019-08-01 ENCOUNTER — Other Ambulatory Visit: Payer: Self-pay

## 2019-08-01 ENCOUNTER — Encounter: Payer: Self-pay | Admitting: Physician Assistant

## 2019-08-01 ENCOUNTER — Ambulatory Visit: Payer: 59 | Admitting: Physician Assistant

## 2019-08-01 VITALS — BP 100/62 | HR 68 | Temp 98.1°F | Resp 14 | Ht 67.0 in | Wt 165.0 lb

## 2019-08-01 DIAGNOSIS — R590 Localized enlarged lymph nodes: Secondary | ICD-10-CM

## 2019-08-01 NOTE — Progress Notes (Signed)
Patient presents to clinic today for follow-up regarding enlarged right tonsillar lymph node.  Since last visit patient has completed her entire course of doxycycline.  Denies any recurrence of sore throat, ear pain or neck tenderness.  Notes the lymph node is much smaller but still present and still feels quite firm.  Denies noting any new lymph nodes elsewhere.  Denies any new symptoms.  Past Medical History:  Diagnosis Date  . CAD (coronary artery disease)    coronary intervention and drug-eluting stent placement STEMI-2011   Cath 02/25/2010:  100% RCA (PCI with Promus DES); minor LAD and CFX (nothing more than 20%)  EF 65% at cath 02/25/2010  . Ectopic pregnancy    (ruptured)  . GERD (gastroesophageal reflux disease)   . Hyperlipemia   . Hypertension   . Myocardial infarction [facility])     Current Outpatient Medications on File Prior to Visit  Medication Sig Dispense Refill  . ALPRAZolam (XANAX) 0.25 MG tablet Take 1 tablet by mouth as needed.    [name] Kitchen aspirin 81 MG tablet Take 1 tablet (81 mg total) by mouth daily.    [name] Kitchen atorvastatin (LIPITOR) 20 MG tablet TAKE 1 TABLET(20 MG) BY MOUTH DAILY 90 tablet 3  . cyclobenzaprine (FLEXERIL) 10 MG tablet Take 10 mg by mouth 3 times/day as needed-between meals & bedtime for muscle spasms (lower back).    . esomeprazole (NEXIUM) 40 MG capsule Take 1 capsule (40 mg total) by mouth daily at 12 noon. 90 capsule 3  . metoprolol tartrate (LOPRESSOR) 25 MG tablet TAKE 1 TABLET(25 MG) BY MOUTH TWICE DAILY 60 tablet 11  . nitroGLYCERIN (NITROSTAT) 0.4 MG SL tablet Place 1 tablet (0.4 mg total) under the tongue every 5 (five) minutes as needed for chest pain. 25 tablet 3   No current facility-administered medications on file prior to visit.    Allergies  Allergen Reactions  . Biaxin [Clarithromycin] Nausea Only  . Penicillins Rash    Family History  Problem Relation Age of Onset  . Coronary artery disease Father   . Heart attack Father   .  Hypertension Father   . Stroke Father   . Hypertension Mother   . Hypertension Sister   . Colon polyps Sister   . Colon polyps Sister   . Colon cancer Neg Hx   . Stomach cancer Neg Hx   . Rectal cancer Neg Hx   . Esophageal cancer Neg Hx   . Liver cancer Neg Hx     Social History   Socioeconomic History  . Marital status: Married    Spouse name: Not on file  . Number of children: 1  . Years of education: Not on file  . Highest education level: Not on file  Occupational History  . Occupation: Dentist  Tobacco Use  . Smoking status: Current Every Day Smoker    Packs/day: 0.50    Years: 35.00    Pack years: 17.50    Types: Cigarettes  . Smokeless tobacco: Never Used  Substance and Sexual Activity  . Alcohol use: Yes    Alcohol/week: 0.0 standard drinks    Comment: rare  . Drug use: No  . Sexual activity: Not on file  Other Topics Concern  . Not on file  Social History Narrative  . Not on file   Social Determinants of Health   Financial Resource Strain:   . Difficulty of Paying Living Expenses:   Food Insecurity:   . Worried About [employer] of  Food in the Last Year:   . [place] in the Last Year:   Transportation Needs:   . Film/video editor (Medical):   [name] Kitchen Lack of Transportation (Non-Medical):   Physical Activity:   . Days of Exercise per Week:   . Minutes of Exercise per Session:   Stress:   . Feeling of Stress :   Social Connections:   . Frequency of Communication with Friends and Family:   . Frequency of Social Gatherings with Friends and Family:   . Attends Religious Services:   . Active Member of Clubs or Organizations:   . Attends Archivist Meetings:   [name] Kitchen Marital Status:    Review of Systems - See HPI.  All other ROS are negative.  BP 100/62   Pulse 68   Temp 98.1 F (36.7 C) (Temporal)   Resp 14   Ht 5\' 7"  (1.702 m)   Wt 165 lb (74.8 kg)   LMP 02/24/2013   SpO2 98%   BMI 25.84 kg/m   Physical Exam Vitals  reviewed.  Constitutional:      Appearance: Normal appearance.  HENT:     Head: Normocephalic and atraumatic.  Eyes:     Conjunctiva/sclera: Conjunctivae normal.  Neck:     Comments: R tonsillar node again palpable and still quite firm. Improved from last check with slight mobility. The node is smaller now around 2 x 2 cm estimated. No other palpable adenopathy on examination. Cardiovascular:     Rate and Rhythm: Normal rate and regular rhythm.  Pulmonary:     Effort: Pulmonary effort is normal.  Musculoskeletal:     Cervical back: Neck supple.  Neurological:     Mental Status: She is alert. Mental status is at baseline.  Psychiatric:        Mood and Affect: Mood normal.     Recent Results (from the past 2160 hour(s))  CBC w/Diff     Status: None   Collection Time: 07/25/19 11:47 AM  Result Value Ref Range   WBC 9.0 4.0 - 10.5 K/uL   RBC 4.31 3.87 - 5.11 Mil/uL   Hemoglobin 13.6 12.0 - 15.0 g/dL   HCT 40.8 36.0 - 46.0 %   MCV 94.6 78.0 - 100.0 fl   MCHC 33.4 30.0 - 36.0 g/dL   RDW 13.1 11.5 - 15.5 %   Platelets 226.0 150.0 - 400.0 K/uL   Neutrophils Relative % 66.2 43.0 - 77.0 %   Lymphocytes Relative 24.9 12.0 - 46.0 %   Monocytes Relative 6.3 3.0 - 12.0 %   Eosinophils Relative 2.0 0.0 - 5.0 %   Basophils Relative 0.6 0.0 - 3.0 %   Neutro Abs 5.9 1.4 - 7.7 K/uL   Lymphs Abs 2.2 0.7 - 4.0 K/uL   Monocytes Absolute 0.6 0.1 - 1.0 K/uL   Eosinophils Absolute 0.2 0.0 - 0.7 K/uL   Basophils Absolute 0.1 0.0 - 0.1 K/uL    Assessment/Plan: 1. Enlarged lymph node in neck Much improved which is reassuring but still quite firm, almost hard. Very slightly mobile today, but improved from last exam. Will get [place] just to be on the cautious side. Order placed. Patient to monitor for any symptoms recurrence. No indication for additional antibiotic therapy at present time.  - US Soft Tissue Head/Neck (NON-THYROID); Future  This visit occurred during the SARS-CoV-2 public health  emergency.  Safety protocols were in place, including screening questions prior to the visit, additional usage of staff PPE,  and extensive cleaning of exam room while observing appropriate contact time as indicated for disinfecting solutions.    Leeanne Rio, PA-C

## 2019-08-01 NOTE — Patient Instructions (Signed)
Things are improved which is good. The area is much smaller but still quite firm (almost hard). Giving firmness and only slight mobility, I am proceeding with [place] to make sure there are no concerning characteristics of this lymph node that would require anything further.   Please speak with Danae Chen at the front desk regarding scheduling the [place].   I will call with results as soon as I have them!

## 2019-08-05 ENCOUNTER — Other Ambulatory Visit: Payer: Self-pay | Admitting: Cardiovascular Disease

## 2019-08-05 ENCOUNTER — Ambulatory Visit
Admission: RE | Admit: 2019-08-05 | Discharge: 2019-08-05 | Disposition: A | Discharge status: Home / Self Care | Payer: 59 | Source: Ambulatory Visit | Attending: Physician Assistant | Admitting: Physician Assistant

## 2019-08-05 DIAGNOSIS — R590 Localized enlarged lymph nodes: Secondary | ICD-10-CM

## 2019-08-08 ENCOUNTER — Other Ambulatory Visit: Payer: Self-pay | Admitting: Emergency Medicine

## 2019-08-08 ENCOUNTER — Telehealth: Payer: Self-pay

## 2019-08-08 ENCOUNTER — Encounter: Payer: Self-pay | Admitting: Physician Assistant

## 2019-08-08 DIAGNOSIS — R590 Localized enlarged lymph nodes: Secondary | ICD-10-CM

## 2019-08-08 NOTE — Telephone Encounter (Signed)
Results have already been reviewed and result note sent to CMA to call patient regarding results and need for CT.

## 2019-08-08 NOTE — Telephone Encounter (Signed)
Shelby Fitzpatrick with [facility] called regarding the patient's soft tissue US of head and neck she had done on Friday. She states per the impression, they recommend a contrast enhanced neck CT for further evaluation.

## 2019-08-09 ENCOUNTER — Ambulatory Visit
Admission: RE | Admit: 2019-08-09 | Discharge: 2019-08-09 | Disposition: A | Discharge status: Home / Self Care | Payer: 59 | Source: Ambulatory Visit | Attending: Physician Assistant | Admitting: Physician Assistant

## 2019-08-09 ENCOUNTER — Other Ambulatory Visit: Payer: Self-pay

## 2019-08-09 DIAGNOSIS — R590 Localized enlarged lymph nodes: Secondary | ICD-10-CM

## 2019-08-09 MED ORDER — IOPAMIDOL (ISOVUE-300) INJECTION 61%
75.0000 mL | Freq: Once | INTRAVENOUS | Status: AC | PRN
  Administered 2019-08-09: 15:00:00 75 mL via INTRAVENOUS

## 2019-08-22 IMAGING — MG DIGITAL DIAGNOSTIC UNILATERAL LEFT MAMMOGRAM WITH TOMO AND CAD
6 series · 6 of 18 positions shown · non-contrast
Comparison: Previous exam(s).

CLINICAL DATA: Screening recall for a possible left breast mass.

EXAM:
DIGITAL DIAGNOSTIC LEFT MAMMOGRAM WITH CAD AND TOMO
ULTRASOUND LEFT BREAST

[L MLO synth-2D (1 of 2)]
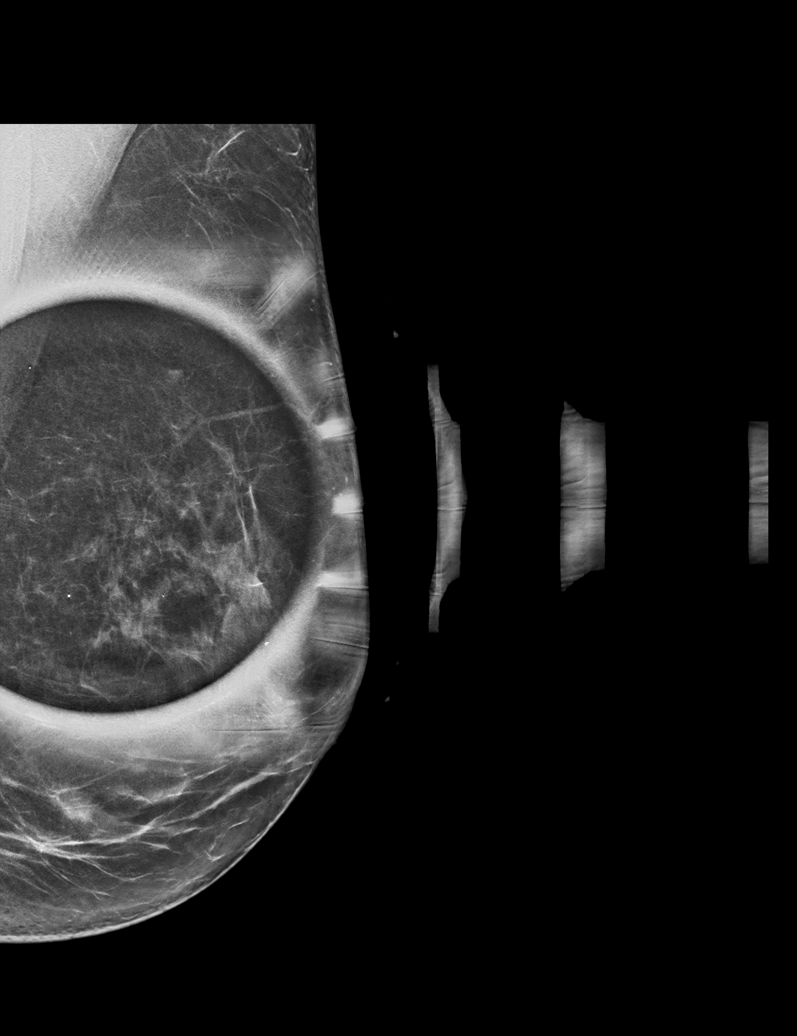

[L MLO synth-2D (2 of 2)]
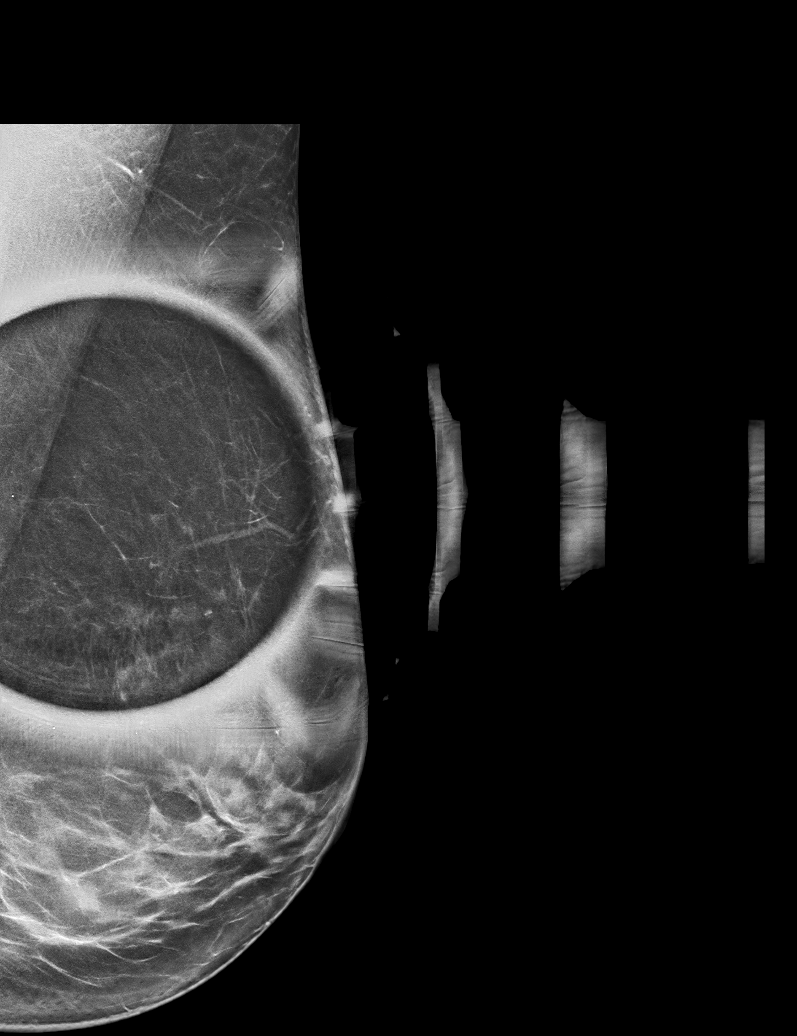

[L CC synth-2D]
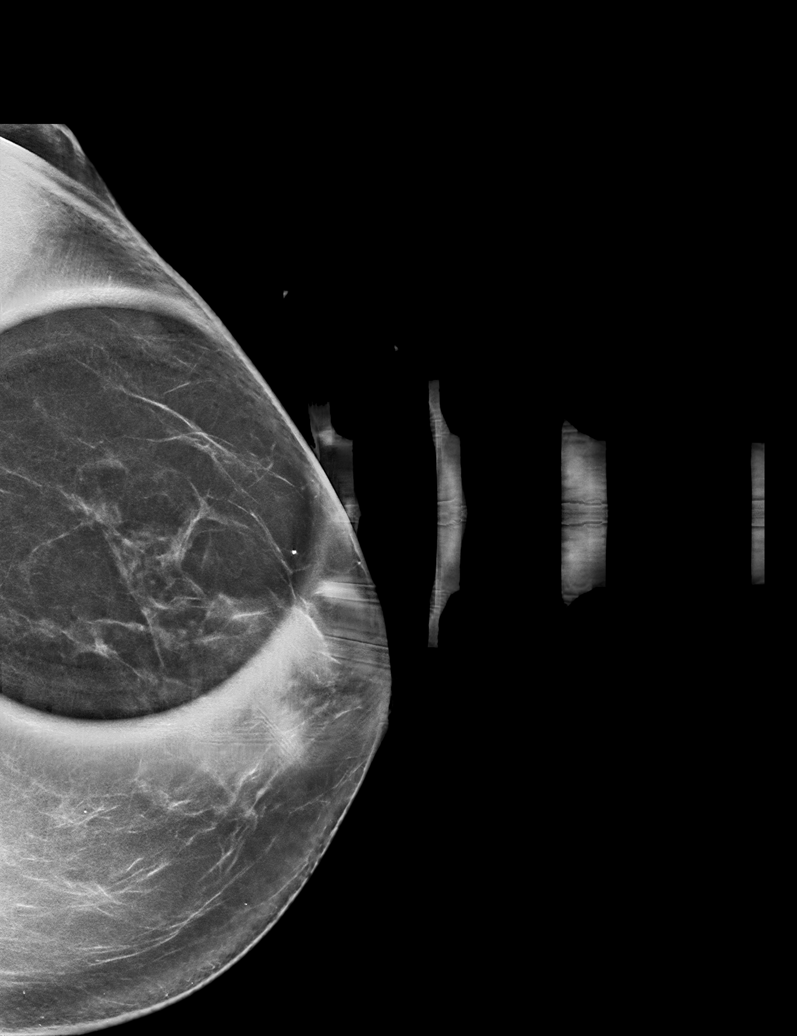

[L MLO tomo (1 of 2) · tomo slice 35/69.0]
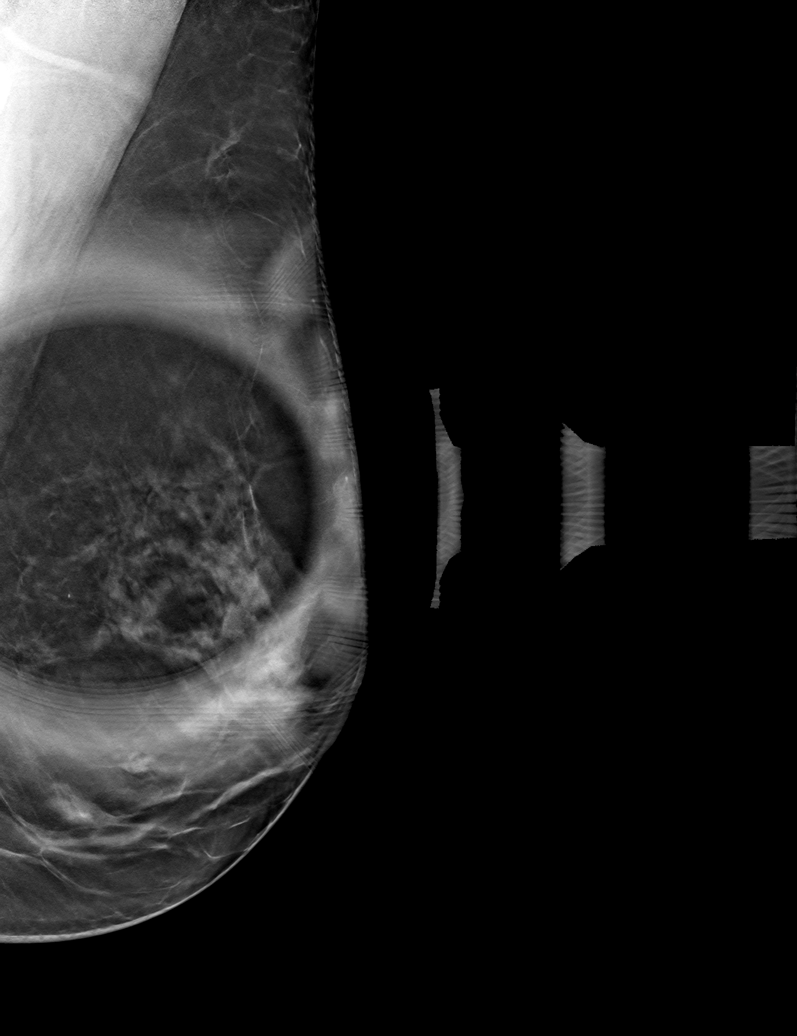

[L CC tomo · tomo slice 36/71.0]
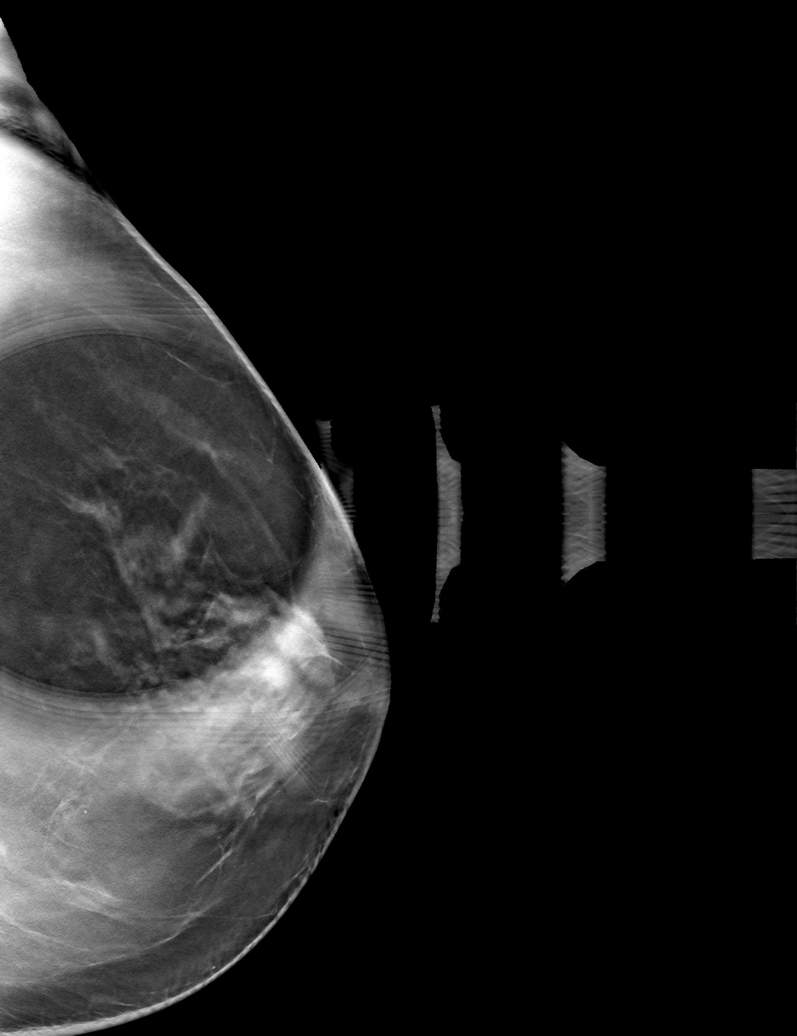

[L MLO tomo (2 of 2) · tomo slice 35/69.0]
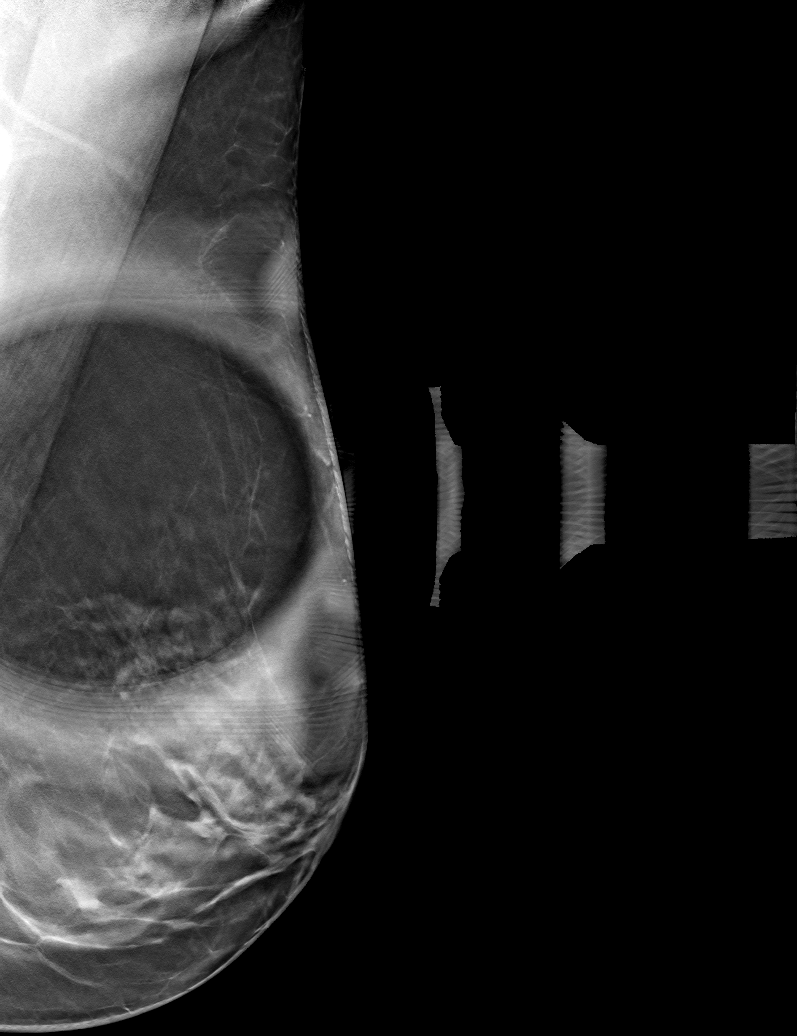

[6 of 18 positions shown; findings below may reference images not displayed]

ACR Breast Density Category c: The breast tissue is heterogeneously
dense, which may obscure small masses.
FINDINGS: There is a persistent circumscribed oval mass in the upper-outer
quadrant of the left breast measuring approximately 7 mm.

Mammographic images were processed with CAD.

On physical exam, no suspicious palpable masses identified in the
low lateral aspect of the left breast.

Targeted ultrasound is performed, showing an anechoic oval
circumscribed mass in the left breast at 3 o'clock, 4 cm from the
nipple measuring 6 x 3 x 5 mm.
IMPRESSION: The mass in the lateral left breast on the screening mammogram
corresponds with a benign cyst at 3 o'clock.

RECOMMENDATION:
Screening mammogram in one year.(Code:B8-5-WOH)

I have discussed the findings and recommendations with the patient.
Results were also provided in writing at the conclusion of the
visit. If applicable, a reminder letter will be sent to the patient
regarding the next appointment.

BI-RADS CATEGORY  2: Benign.

## 2019-08-22 NOTE — Progress Notes (Signed)
Cardiology Office Note   Date:  08/23/2019   ID:  Shelby Fitzpatrick, DOB [birth year], MRN [id number]  PCP:  [name], PA-C  Cardiologist:  Dr. Sherren Mocha, MD   Chief Complaint  Patient presents with  . Follow-up    History of Present Illness: Shelby Fitzpatrick is a 54 y.o. female who presents for 1 year follow up, seen for Dr. Burt Knack.   [name] has a hx of coronary artery disease, initially presenting withinferior wall MI in 2011 treated with a drug-eluting stent in the right coronary artery. She underwent repeat heart catheterization in 2017 demonstrating stent patency and no other significant CAD.  She has been doing well from a CV standpoint however she is developed issues with anxiety for which she takes Xanax as needed and uses it sparingly.    On last office visit, she had complaints of few fleeting episodes of chest pain for which a treadmill stress test was recommended however she deferred. Symptoms resolved with the initiation of PPI therapy.  Today, she is doing well from a CV standpoint with no anginal symptoms over the last year. She states she is getting ready for a trip to [place] to celebrate her daughter's graduation from college.  Trip was originally planned for last year however this was postponed secondary to Covid.  She denies chest pain, shortness of breath, LE edema, palpitations, orthopnea, dizziness or syncope.  Past Medical History:  Diagnosis Date  . CAD (coronary artery disease)    coronary intervention and drug-eluting stent placement STEMI-2011   Cath 02/25/2010:  100% RCA (PCI with Promus DES); minor LAD and CFX (nothing more than 20%)  EF 65% at cath 02/25/2010  . Ectopic pregnancy    (ruptured)  . GERD (gastroesophageal reflux disease)   . Hyperlipemia   . Hypertension   . Myocardial infarction [facility])     Past Surgical History:  Procedure Laterality Date  . ANKLE SURGERY     broken and had surgery to reapair  .  APPENDECTOMY     at age 83  . CARDIAC CATHETERIZATION N/A 05/29/2015   Procedure: Left Heart Cath and Coronary Angiography;  Surgeon: Jettie Booze, MD;  Location: [place] [facility];  Service: Cardiovascular;  Laterality: N/A;  . coronary intervention      drug-eluting stent placement-2011  . tubal pregnancy x2     cant recall year   Current Outpatient Medications  Medication Sig Dispense Refill  . ALPRAZolam (XANAX) 0.25 MG tablet Take 1 tablet by mouth as needed.    [name] Kitchen aspirin 81 MG tablet Take 1 tablet (81 mg total) by mouth daily.    [name] Kitchen atorvastatin (LIPITOR) 20 MG tablet TAKE 1 TABLET(20 MG) BY MOUTH DAILY 90 tablet 3  . cyclobenzaprine (FLEXERIL) 10 MG tablet Take 10 mg by mouth 3 times/day as needed-between meals & bedtime for muscle spasms (lower back).    . esomeprazole (NEXIUM) 40 MG capsule Take 1 capsule (40 mg total) by mouth daily at 12 noon. 90 capsule 3  . metoprolol tartrate (LOPRESSOR) 25 MG tablet TAKE 1 TABLET(25 MG) BY MOUTH TWICE DAILY 60 tablet 11  . nitroGLYCERIN (NITROSTAT) 0.4 MG SL tablet Place 1 tablet (0.4 mg total) under the tongue every 5 (five) minutes as needed for chest pain. 25 tablet 3   No current facility-administered medications for this visit.    Allergies:   Biaxin [clarithromycin] and Penicillins    Social History:  The patient  reports that  she has been smoking cigarettes. She has a 17.50 pack-year smoking history. She has never used smokeless tobacco. She reports current alcohol use. She reports that she does not use drugs.   Family History:  The patient's family history includes Colon polyps in her sister and sister; Coronary artery disease in her father; Heart attack in her father; Hypertension in her father, mother, and sister; Stroke in her father.    ROS:  Please see the history of present illness.   Otherwise, review of systems are positive for none.  All other systems are reviewed and negative.    PHYSICAL EXAM: VS:  Ht 5\' 7"   (1.702 m)   LMP 02/24/2013   BMI 25.84 kg/m  , BMI Body mass index is 25.84 kg/m.   General: Well developed, well nourished, NAD Neck: Negative for carotid bruits. No JVD Lungs:Clear to ausculation bilaterally. No wheezes, rales, or rhonchi. Breathing is unlabored. Cardiovascular: RRR with S1 S2. No murmurs Extremities: No edema. Radial pulses 2+ bilaterally Neuro: Alert and oriented. No focal deficits. No facial asymmetry. MAE spontaneously. Psych: Responds to questions appropriately with normal affect.     EKG:  EKG is ordered today. The ekg ordered today demonstrates NSR with no acute changes, HR 60bpm  Recent Labs: 03/07/2019: ALT 17; BUN 20; Creatinine 0.8; Potassium 5.0; Sodium 143; TSH 2.15 07/25/2019: Hemoglobin 13.6; Platelets 226.0   Lipid Panel    Component Value Date/Time   CHOL 140 03/07/2019 0000   CHOL 132 08/03/2017 0758   TRIG 147 03/07/2019 0000   HDL 48 03/07/2019 0000   HDL 57 08/03/2017 0758   CHOLHDL 2.3 08/03/2017 0758   CHOLHDL 2.5 07/20/2015 0814   VLDL 24 07/20/2015 0814   LDLCALC 75 03/07/2019 0000   LDLCALC 50 08/03/2017 0758    Wt Readings from Last 3 Encounters:  08/01/19 165 lb (74.8 kg)  07/25/19 165 lb (74.8 kg)  02/14/19 166 lb (75.3 kg)    Other studies Reviewed: Additional studies/ records that were reviewed today include:   LHC 05/2015:   Patent stent in the RCA.  The left ventricular systolic function is normal.  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   Will stop Brilinta which was started in the office yesterday in anticipation of need for PCI.  Continue aggressive secondary prevention.    ASSESSMENT AND PLAN:  1.  CAD: -s/p PCI/DES to RCA in 2011 with repeat cardiac catheterization in 2017 with patent stent and no other CAD -Denies anginal symptoms -Continue ASA, statin, beta-blocker  2.  Hypertension: -Stable, 134/60 -Continue current regimen  3.  Mixed hyperlipidemia: -Last LDL, 71 -Lifestyle modification  with increased aerobic exercise and low saturated fat diet was discussed -Repeat lab work today   Current medicines are reviewed at length with the patient today.  The patient does not have concerns regarding medicines.  The following changes have been made:  no change  Labs/ tests ordered today include: Lipid, CMET, CBC No orders of the defined types were placed in this encounter.   Disposition:   FU with Dr. Burt Knack in 1 year  Signed, Kathyrn Drown, NP  08/23/2019 8:09 AM    [address], [place], [place]  [address] Phone: [phone]; Fax: [phone]

## 2019-08-23 ENCOUNTER — Other Ambulatory Visit: Payer: Self-pay

## 2019-08-23 ENCOUNTER — Ambulatory Visit: Payer: 59 | Admitting: Cardiology

## 2019-08-23 ENCOUNTER — Encounter: Payer: Self-pay | Admitting: Cardiology

## 2019-08-23 ENCOUNTER — Ambulatory Visit: Payer: Managed Care, Other (non HMO) | Admitting: Physician Assistant

## 2019-08-23 VITALS — BP 134/60 | HR 60 | Ht 67.0 in | Wt 164.1 lb

## 2019-08-23 DIAGNOSIS — I10 Essential (primary) hypertension: Secondary | ICD-10-CM

## 2019-08-23 DIAGNOSIS — E782 Mixed hyperlipidemia: Secondary | ICD-10-CM | POA: Diagnosis not present

## 2019-08-23 DIAGNOSIS — I259 Chronic ischemic heart disease, unspecified: Secondary | ICD-10-CM

## 2019-08-23 DIAGNOSIS — I2510 Atherosclerotic heart disease of native coronary artery without angina pectoris: Secondary | ICD-10-CM

## 2019-08-23 LAB — COMPREHENSIVE METABOLIC PANEL
ALT: 16 IU/L (ref 0–32)
AST: 18 IU/L (ref 0–40)
Albumin/Globulin Ratio: 2 (ref 1.2–2.2)
Albumin: 4.5 g/dL (ref 3.8–4.9)
Alkaline Phosphatase: 78 IU/L (ref 39–117)
BUN/Creatinine Ratio: 27 — ABNORMAL HIGH (ref 9–23)
BUN: 20 mg/dL (ref 6–24)
Bilirubin Total: 0.2 mg/dL (ref 0.0–1.2)
CO2: 23 mmol/L (ref 20–29)
Calcium: 10.1 mg/dL (ref 8.7–10.2)
Chloride: 104 mmol/L (ref 96–106)
Creatinine, Ser: 0.74 mg/dL (ref 0.57–1.00)
GFR calc Af Amer: 108 mL/min/{1.73_m2} (ref >59)
GFR calc non Af Amer: 93 mL/min/{1.73_m2} (ref >59)
Globulin, Total: 2.3 g/dL (ref 1.5–4.5)
Glucose: 91 mg/dL (ref 65–99)
Potassium: 4.9 mmol/L (ref 3.5–5.2)
Sodium: 140 mmol/L (ref 134–144)
Total Protein: 6.8 g/dL (ref 6.0–8.5)

## 2019-08-23 LAB — LIPID PANEL
Chol/HDL Ratio: 3 ratio (ref 0.0–4.4)
Cholesterol, Total: 149 mg/dL (ref 100–199)
HDL: 49 mg/dL (ref >39)
LDL Chol Calc (NIH): 68 mg/dL (ref 0–99)
Triglycerides: 193 mg/dL — ABNORMAL HIGH (ref 0–149)
VLDL Cholesterol Cal: 32 mg/dL (ref 5–40)

## 2019-08-23 LAB — CBC
Hematocrit: 42.5 % (ref 34.0–46.6)
Hemoglobin: 14.1 g/dL (ref 11.1–15.9)
MCH: 31.3 pg (ref 26.6–33.0)
MCHC: 33.2 g/dL (ref 31.5–35.7)
MCV: 94 fL (ref 79–97)
Platelets: 256 10*3/uL (ref 150–450)
RBC: 4.5 x10E6/uL (ref 3.77–5.28)
RDW: 12.6 % (ref 11.7–15.4)
WBC: 6.5 10*3/uL (ref 3.4–10.8)

## 2019-08-23 MED ORDER — ESOMEPRAZOLE MAGNESIUM 40 MG PO CPDR: 40.0000 mg | DELAYED_RELEASE_CAPSULE | Freq: Every day | ORAL | 12 refills | Status: DC

## 2019-08-23 MED ORDER — ATORVASTATIN CALCIUM 20 MG PO TABS: 20.0000 mg | ORAL_TABLET | Freq: Every day | ORAL | 12 refills | Status: DC

## 2019-08-23 MED ORDER — NITROGLYCERIN 0.4 MG SL SUBL: 0.4000 mg | SUBLINGUAL_TABLET | SUBLINGUAL | 6 refills | Status: DC | PRN

## 2019-08-23 NOTE — Patient Instructions (Signed)
Medication Instructions:   Your physician recommends that you continue on your current medications as directed. Please refer to the Current Medication list given to you today.  *If you need a refill on your cardiac medications before your next appointment, please call your pharmacy*  Lab Work:  You will have labs drawn today: CMET/Lipids/CBC  If you have labs (blood work) drawn today and your tests are completely normal, you will receive your results only by: [name] Kitchen MyChart Message (if you have MyChart) OR . A paper copy in the mail If you have any lab test that is abnormal or we need to change your treatment, we will call you to review the results.  Testing/Procedures:  None ordered today  Follow-Up: At [facility], you and your health needs are our priority.  As part of our continuing mission to provide you with exceptional heart care, we have created designated Provider Care Teams.  These Care Teams include your primary Cardiologist (physician) and Advanced Practice Providers (APPs -  Physician Assistants and Nurse Practitioners) who all work together to provide you with the care you need, when you need it.  We recommend signing up for the patient portal called "MyChart".  Sign up information is provided on this After Visit Summary.  MyChart is used to connect with patients for Virtual Visits (Telemedicine).  Patients are able to view lab/test results, encounter notes, upcoming appointments, etc.  Non-urgent messages can be sent to your provider as well.   To learn more about what you can do with MyChart, go to [email or web address].    Your next appointment:   12 month(s)  The format for your next appointment:   In Person  Provider:   Sherren Mocha, MD

## 2019-08-25 ENCOUNTER — Ambulatory Visit (INDEPENDENT_AMBULATORY_CARE_PROVIDER_SITE_OTHER): Payer: 59 | Admitting: Otolaryngology

## 2019-08-25 ENCOUNTER — Other Ambulatory Visit: Payer: Self-pay

## 2019-08-25 ENCOUNTER — Encounter (INDEPENDENT_AMBULATORY_CARE_PROVIDER_SITE_OTHER): Payer: Self-pay | Admitting: Otolaryngology

## 2019-08-25 VITALS — Temp 97.9°F

## 2019-08-25 DIAGNOSIS — K143 Hypertrophy of tongue papillae: Secondary | ICD-10-CM | POA: Diagnosis not present

## 2019-08-25 NOTE — Progress Notes (Signed)
HPI: Shelby Fitzpatrick is a 54 y.o. female who presents is referred by PCP for evaluation of coating on her tongue that she has had for years.  She has noticed a yellow coating on the back portion of her tongue for several years.  She tries to scrape it off.  Denies sore tongue or sore throat.  She estimates she has had this for about 3 years. She does smoke three quarters of a pack a day.. She is on no antibiotics or steroids.  Past Medical History:  Diagnosis Date  . CAD (coronary artery disease)    coronary intervention and drug-eluting stent placement STEMI-2011   Cath 02/25/2010:  100% RCA (PCI with Promus DES); minor LAD and CFX (nothing more than 20%)  EF 65% at cath 02/25/2010  . Ectopic pregnancy    (ruptured)  . GERD (gastroesophageal reflux disease)   . Hyperlipemia   . Hypertension   . Myocardial infarction [facility])    Past Surgical History:  Procedure Laterality Date  . ANKLE SURGERY     broken and had surgery to reapair  . APPENDECTOMY     at age 35  . CARDIAC CATHETERIZATION N/A 05/29/2015   Procedure: Left Heart Cath and Coronary Angiography;  Surgeon: Jettie Booze, MD;  Location: [place] [facility];  Service: Cardiovascular;  Laterality: N/A;  . coronary intervention      drug-eluting stent placement-2011  . tubal pregnancy x2     cant recall year   Social History   Socioeconomic History  . Marital status: Married    Spouse name: Not on file  . Number of children: 1  . Years of education: Not on file  . Highest education level: Not on file  Occupational History  . Occupation: Dentist  Tobacco Use  . Smoking status: Current Every Day Smoker    Packs/day: 0.50    Years: 35.00    Pack years: 17.50    Types: Cigarettes    Start date: 49  . Smokeless tobacco: Never Used  Substance and Sexual Activity  . Alcohol use: Yes    Alcohol/week: 0.0 standard drinks    Comment: rare  . Drug use: No  . Sexual activity: Not on file  Other Topics Concern  .  Not on file  Social History Narrative  . Not on file   Social Determinants of Health   Financial Resource Strain:   . Difficulty of Paying Living Expenses:   Food Insecurity:   . Worried About Charity fundraiser in the Last Year:   . Arboriculturist in the Last Year:   Transportation Needs:   . Film/video editor (Medical):   [name] Kitchen Lack of Transportation (Non-Medical):   Physical Activity:   . Days of Exercise per Week:   . Minutes of Exercise per Session:   Stress:   . Feeling of Stress :   Social Connections:   . Frequency of Communication with Friends and Family:   . Frequency of Social Gatherings with Friends and Family:   . Attends Religious Services:   . Active Member of Clubs or Organizations:   . Attends Archivist Meetings:   [name] Kitchen Marital Status:    Family History  Problem Relation Age of Onset  . Coronary artery disease Father   . Heart attack Father   . Hypertension Father   . Stroke Father   . Hypertension Mother   . Hypertension Sister   . Colon polyps Sister   .  Colon polyps Sister   . Colon cancer Neg Hx   . Stomach cancer Neg Hx   . Rectal cancer Neg Hx   . Esophageal cancer Neg Hx   . Liver cancer Neg Hx    Allergies  Allergen Reactions  . Biaxin [Clarithromycin] Nausea Only  . Penicillins Rash   Prior to Admission medications   Medication Sig Start Date End Date Taking? Authorizing Provider  ALPRAZolam Duanne Moron) 0.25 MG tablet Take 1 tablet by mouth as needed.   Yes [provider]  aspirin 81 MG tablet Take 1 tablet (81 mg total) by mouth daily. 07/29/10  Yes Sherren Mocha, MD  atorvastatin (LIPITOR) 20 MG tablet Take 1 tablet (20 mg total) by mouth daily. 08/23/19  Yes Kathyrn Drown D, NP  cyclobenzaprine (FLEXERIL) 10 MG tablet Take 10 mg by mouth 3 times/day as needed-between meals & bedtime for muscle spasms (lower back).   Yes [provider]  esomeprazole (NEXIUM) 40 MG capsule Take 1 capsule (40 mg total) by  mouth daily at 12 noon. 08/23/19  Yes Kathyrn Drown D, NP  metoprolol tartrate (LOPRESSOR) 25 MG tablet TAKE 1 TABLET(25 MG) BY MOUTH TWICE DAILY 08/05/19  Yes Sherren Mocha, MD  nitroGLYCERIN (NITROSTAT) 0.4 MG SL tablet Place 1 tablet (0.4 mg total) under the tongue every 5 (five) minutes as needed for chest pain. 08/23/19  Yes Kathyrn Drown D, NP     Positive ROS: Otherwise negative  All other systems have been reviewed and were otherwise negative with the exception of those mentioned in the HPI and as above.  Physical Exam: Constitutional: Alert, well-appearing, no acute distress Ears: External ears without lesions or tenderness. Ear canals are clear bilaterally with intact, clear TMs.  Nasal: External nose without lesions.. Clear nasal passages Oral: Lips and gums without lesions. Tongue and palate mucosa without lesions. Posterior oropharynx clear.  On evaluation of the dorsum of her tongue she does have a slight yellow coating on the posterior portion of the dorsum of the tongue.  This is soft to palpation.  Oral mucosa is otherwise clear with no evidence of thrush. Indirect laryngoscopy revealed a clear base of tongue vallecula and epiglottis.  Vocal cords were clear bilaterally with normal vocal cord mobility. Neck: No palpable adenopathy or masses Respiratory: Breathing comfortably  Skin: No facial/neck lesions or rash noted.  Procedures  Assessment: Tongue coating appears within normal limits.  No evidence of infection or neoplasm.  Plan: Reviewed with patient concerning oral hygiene consisting of rinses with antiseptic such as Listerine. Can also take probiotics or yogurt which helps in some cases. Also suggested use of Biotene mouth rinse which helps with the dry mouth. I suspect smoking contributes some to this and would recommend also stop smoking if possible. Reassured her of no evidence of active infection or neoplasm.   Radene Journey, MD   CC:

## 2019-12-04 ENCOUNTER — Other Ambulatory Visit: Payer: Self-pay | Admitting: Cardiovascular Disease

## 2020-05-10 LAB — HM PAP SMEAR: HM Pap smear: NEGATIVE

## 2020-05-11 ENCOUNTER — Encounter: Payer: Self-pay | Admitting: Gastroenterology

## 2020-05-11 ENCOUNTER — Ambulatory Visit: Payer: 59 | Admitting: Gastroenterology

## 2020-05-11 ENCOUNTER — Encounter: Payer: Self-pay | Admitting: Emergency Medicine

## 2020-05-11 VITALS — BP 140/80 | HR 76 | Ht 67.0 in | Wt 168.2 lb

## 2020-05-11 DIAGNOSIS — Z1211 Encounter for screening for malignant neoplasm of colon: Secondary | ICD-10-CM | POA: Diagnosis not present

## 2020-05-11 DIAGNOSIS — R194 Change in bowel habit: Secondary | ICD-10-CM

## 2020-05-11 DIAGNOSIS — K219 Gastro-esophageal reflux disease without esophagitis: Secondary | ICD-10-CM | POA: Diagnosis not present

## 2020-05-11 DIAGNOSIS — Z8711 Personal history of peptic ulcer disease: Secondary | ICD-10-CM

## 2020-05-11 MED ORDER — SUTAB 1479-225-188 MG PO TABS: 1.0000 | ORAL_TABLET | Freq: Once | ORAL | 0 refills | Status: AC

## 2020-05-11 MED ORDER — ESOMEPRAZOLE MAGNESIUM 20 MG PO CPDR: 20.0000 mg | DELAYED_RELEASE_CAPSULE | Freq: Every day | ORAL | 3 refills | Status: DC

## 2020-05-11 NOTE — Progress Notes (Signed)
HPI :  55 year old female with a history of peptic ulcer disease, GERD, CAD, referred by Raiford Noble, PA for change in bowel habits, GERD  I have not seen the patient since April 2018.  She initially presented to our office earlier that year with epigastric pain in the setting of NSAID use.  She had a cratered gastric ulcer at the pylorus on an EGD as well as gastritis.  She tested positive for H. pylori and was treated with Pylera.  She had a follow-up EGD in April of that year which showed interval healing of the ulcer and H. pylori eradicated.  She has been taking Nexium 40 mg a day for her reflux since that time.  She has been avoiding NSAIDs for the most part in light of her history of ulcers.  She states her reflux is well controlled and does not really bother her on the current regimen.  She has not had no recurrence of epigastric pain or previous symptoms of ulcers.  She states at baseline she has a bowel movement 2-3 times a month.  At baseline this has been stable for her and she appears to tolerate this well without any abdominal pain.  She states around Christmas time she had altered stool form, it seemed " fatty" and paler in color although remained brownish-orange in color, no blood or dark stools.  She had increased frequency during that week or so but has since seem to go back to normal.  She does feel that she has some bloating after she eats certain foods.  Having a bowel movement does not really make her feel too much better.  Her stools are occasionally hard to pass.  She denies any family history of colon cancer but has 2 sisters who have had polyps.  She states her mother died of a " gangrenous colon".  She has never had a colonoscopy.  We discussed options moving forward  EGD 06/09/16 -  - The exam of the esophagus was otherwise normal. - One non-bleeding cratered clean based gastric ulcer with no stigmata of bleeding was found at the pylorus. The lesion was 7 mm in largest  dimension. - Diffuse moderate inflammation characterized by erythema, friability and granularity was found in the gastric body and in the gastric antrum. Biopsies were taken with a cold forceps for Helicobacter pylori testing. - The exam of the stomach was otherwise normal. - The duodenal bulb and second portion of the duodenum were normal.  Surgical [P], gastric antrum and gastric body - GASTRIC EPITHELIUM, NO HISTOPATHOLOGICALLY SIGNIFICANT ABNORMALITY IDENTIFIED. - WARTHIN-STARRY STAINING POSITIVE FOR MULTIPLE ORGANISMS CONSISTENT WITH HELICOBACTER PYLORI.  EGD 08/04/2016 - - The exam of the esophagus was otherwise normal. - One non-bleeding superficial gastric ulcer with no stigmata of bleeding was found at the pylorus. The lesion was 2-3 mm in largest dimension, significantly improved from the last exam. Biopsies were taken from the periphery of it with a cold forceps for histology, although appears to be healing as expected. - The exam of the stomach was otherwise normal. - Biopsies were taken with a cold forceps in the gastric body and in the gastric antrum for Helicobacter pylori testing. - The duodenal bulb and second portion of the duodenum were normal.  1. Surgical [P], gastric ulcer BX - LOCALIZED ACUTE GASTRITIS WITH REACTIVE EPITHELIAL CHANGES. - NO DYSPLASIA OR MALIGNANCY IDENTIFIED. - WARTHIN-STARRY STAINING NEGATIVE FOR HELICOBACTER PYLORI. 2. Surgical [P], gastric antrum and gastric body - GASTRIC EPITHELIUM. - NO HISTOPATHOLOGICALLY  SIGNIFICANT ABNORMALITY IDENTIFIED. - WARTHIN-STARRY STAINING NEGATIVE FOR HELICOBACTER PYLORI.    Past Medical History:  Diagnosis Date  . CAD (coronary artery disease)    coronary intervention and drug-eluting stent placement STEMI-2011   Cath 02/25/2010:  100% RCA (PCI with Promus DES); minor LAD and CFX (nothing more than 20%)  EF 65% at cath 02/25/2010  . Chronic headaches   . Ectopic pregnancy    (ruptured)  . Gastric ulcer    . GERD (gastroesophageal reflux disease)   . Hyperlipemia   . Hypertension   . Myocardial infarction [facility])      Past Surgical History:  Procedure Laterality Date  . ANKLE SURGERY     broken and had surgery to reapair  . APPENDECTOMY     at age 76  . CARDIAC CATHETERIZATION N/A 05/29/2015   Procedure: Left Heart Cath and Coronary Angiography;  Surgeon: Jettie Booze, MD;  Location: [place] [facility];  Service: Cardiovascular;  Laterality: N/A;  . coronary intervention      drug-eluting stent placement-2011  . tubal pregnancy x2     cant recall year   Family History  Problem Relation Age of Onset  . Coronary artery disease Father   . Heart attack Father   . Hypertension Father   . Stroke Father   . Hypertension Mother   . Hypertension Sister   . Colon polyps Sister   . Colon polyps Sister   . Colon cancer Neg Hx   . Stomach cancer Neg Hx   . Rectal cancer Neg Hx   . Esophageal cancer Neg Hx   . Liver cancer Neg Hx    Social History   Tobacco Use  . Smoking status: Current Every Day Smoker    Packs/day: 0.50    Years: 35.00    Pack years: 17.50    Types: Cigarettes    Start date: 12  . Smokeless tobacco: Never Used  Vaping Use  . Vaping Use: Never used  Substance Use Topics  . Alcohol use: Yes    Alcohol/week: 0.0 standard drinks    Comment: rare  . Drug use: No   Current Outpatient Medications  Medication Sig Dispense Refill  . ALPRAZolam (XANAX) 0.25 MG tablet Take 1 tablet by mouth as needed.    [name] Kitchen aspirin 81 MG tablet Take 1 tablet (81 mg total) by mouth daily.    [name] Kitchen atorvastatin (LIPITOR) 20 MG tablet TAKE 1 TABLET(20 MG) BY MOUTH DAILY 90 tablet 3  . cyclobenzaprine (FLEXERIL) 10 MG tablet Take 10 mg by mouth 3 times/day as needed-between meals & bedtime for muscle spasms (lower back).    [name] Kitchen liothyronine (CYTOMEL) 5 MCG tablet Take 5 mcg by mouth daily.    . metoprolol tartrate (LOPRESSOR) 25 MG tablet TAKE 1 TABLET(25 MG) BY MOUTH TWICE DAILY 60  tablet 11  . NALTREXONE HCL PO Take 1 tablet by mouth at bedtime.    . nitroGLYCERIN (NITROSTAT) 0.4 MG SL tablet Place 1 tablet (0.4 mg total) under the tongue every 5 (five) minutes as needed for chest pain. 30 tablet 6  . Sodium Sulfate-Mag Sulfate-KCl (SUTAB) [phone] MG TABS Take 1 kit by mouth once for 1 dose. [name] KitchenMANUFACTURER CODES!! BIN: [id number] PCN: CN GROUP: [id number] MEMBER ID: [id number];CNO AS SECONDARY INSURANCE ;NO PRIOR AUTHORIZATION 24 tablet 0  . esomeprazole (NEXIUM) 20 MG capsule Take 1 capsule (20 mg total) by mouth daily at 12 noon. 90 capsule 3   No current facility-administered medications for this visit.  Allergies  Allergen Reactions  . Biaxin [Clarithromycin] Nausea Only  . Penicillins Rash     Review of Systems: All systems reviewed and negative except where noted in HPI.   Lab Results  Component Value Date   WBC 6.5 08/23/2019   HGB 14.1 08/23/2019   HCT 42.5 08/23/2019   MCV 94 08/23/2019   PLT 256 08/23/2019    Lab Results  Component Value Date   CREATININE 0.74 08/23/2019   BUN 20 08/23/2019   NA 140 08/23/2019   K 4.9 08/23/2019   CL 104 08/23/2019   CO2 23 08/23/2019    Lab Results  Component Value Date   ALT 16 08/23/2019   AST 18 08/23/2019   ALKPHOS 78 08/23/2019   BILITOT 0.2 08/23/2019      Physical Exam: BP 140/80   Pulse 76   Ht _0  (1.702 m)   Wt 168 lb 3.2 oz (76.3 kg)   LMP 02/24/2013   SpO2 97%   BMI 26.34 kg/m  Constitutional: Pleasant,well-developed, female in no acute distress. HEENT: Normocephalic and atraumatic. Conjunctivae are normal. No scleral icterus. Neck supple.  Cardiovascular: Normal rate, regular rhythm.  Pulmonary/chest: Effort normal and breath sounds normal.  Abdominal: Soft, nondistended, nontender. There are no masses palpable.  Extremities: no edema Lymphadenopathy: No cervical adenopathy noted. Neurological: Alert and oriented to person place and time. Skin: Skin is warm and dry. No  rashes noted. Psychiatric: Normal mood and affect. Behavior is normal.   ASSESSMENT AND PLAN: 55 year old female here establish care for the following:  GERD / history of gastric ulcer - as above had gastric ulcer in the setting of H. pylori and NSAID use in 2018, treated for H. pylori with eradication testing negative, and in setting of PPI use her ulcer healed.  She has some chronic reflux symptoms which appear well controlled with current dose of Nexium 40 mg a day.  We discussed reflux in general, she has no history of Barrett's esophagus on prior EGD.  We discussed long-term risk benefits of chronic PPI use.  She is on a moderate dose of Nexium and we discussed trying to titrate down to a lower dose if she tolerates it might be better long-term to reduce risks of the chronic PPI.  She was agreeable to this.  We will decrease Nexium to 20 mg once daily and see how that goes over the next few weeks.  If her symptoms remain well controlled at this would continue it can try taking it as needed.  If however, her symptoms rebound and she does poorly longer-term on lower dose, okay to escalate back to 40 mg dosing if needed on a daily basis.  Long-term she understands we would prefer to use lowest dose of PPI needed to control symptoms.  She can follow-up as needed for this issue.  Altered bowel habits / CRC screening - baseline she has reduced stool frequency however does not have much symptoms from this in general.  She has had some altered bowel habits in regards to change in stool form for about a week or 2 but has since seemed to improve on its own.  She has multiple siblings with colon polyp history but she has not had a prior colonoscopy.  Recommend optical colonoscopy for screening purposes if she can tolerate it.  I discussed risk benefits of colonoscopy and anesthesia with her and she wanted to proceed.  I think at her current stool frequency her prep may not be adequate  without making a change.  I  asked her to take MiraLAX once to twice a day every day for 1 to 2 weeks prior to scheduled colonoscopy to get her bowels moving more frequently.  Hopefully the bowel prep was more successful in that setting.  She agrees, further recommendations pending results.    Hailey Cellar, MD [facility]  CC: [name], PA-C

## 2020-05-11 NOTE — Patient Instructions (Addendum)
If you are age 53 or older, your body mass index should be between 23-30. Your Body mass index is 26.34 kg/m. If this is out of the aforementioned range listed, please consider follow up with your Primary Care Provider.  If you are age 63 or younger, your body mass index should be between 19-25. Your Body mass index is 26.34 kg/m. If this is out of the aformentioned range listed, please consider follow up with your Primary Care Provider.   You have been scheduled for a colonoscopy. Please follow written instructions given to you at your visit today.  Please pick up your prep supplies at the pharmacy within the next 1-3 days. If you use inhalers (even only as needed), please bring them with you on the day of your procedure.   Take Miralax once to twice daily for 2 weeks leading up to your colonoscopy procedure.  Decrease your Nexium 20 mg once daily.  We have sent a new prescription  to your pharmacy for you to pick up at your convenience.  Thank you for entrusting me with your care and for choosing [facility], Dr. Archer Cellar

## 2020-05-25 ENCOUNTER — Ambulatory Visit (AMBULATORY_SURGERY_CENTER): Payer: 59 | Admitting: Gastroenterology

## 2020-05-25 ENCOUNTER — Other Ambulatory Visit: Payer: Self-pay | Admitting: Gastroenterology

## 2020-05-25 ENCOUNTER — Other Ambulatory Visit: Payer: Self-pay

## 2020-05-25 ENCOUNTER — Encounter: Payer: Self-pay | Admitting: Gastroenterology

## 2020-05-25 VITALS — BP 137/77 | HR 58 | Temp 97.8°F | Resp 13 | Ht 67.0 in | Wt 168.0 lb

## 2020-05-25 DIAGNOSIS — D122 Benign neoplasm of ascending colon: Secondary | ICD-10-CM

## 2020-05-25 DIAGNOSIS — Z1211 Encounter for screening for malignant neoplasm of colon: Secondary | ICD-10-CM | POA: Diagnosis not present

## 2020-05-25 DIAGNOSIS — D125 Benign neoplasm of sigmoid colon: Secondary | ICD-10-CM | POA: Diagnosis not present

## 2020-05-25 DIAGNOSIS — D123 Benign neoplasm of transverse colon: Secondary | ICD-10-CM

## 2020-05-25 DIAGNOSIS — D120 Benign neoplasm of cecum: Secondary | ICD-10-CM | POA: Diagnosis not present

## 2020-05-25 DIAGNOSIS — K635 Polyp of colon: Secondary | ICD-10-CM | POA: Diagnosis not present

## 2020-05-25 MED ORDER — SODIUM CHLORIDE 0.9 % IV SOLN: 500.0000 mL | Freq: Once | INTRAVENOUS | Status: DC

## 2020-05-25 NOTE — Op Note (Signed)
[place] Patient Name: Shelby Fitzpatrick Procedure Date: 05/25/2020 3:08 PM MRN: [id number] Endoscopist: Remo Lipps P. Havery Moros , MD Age: 55 Referring MD:  Date of Birth: May 16, 1965 Gender: Female Account #: [id number] Procedure:                Colonoscopy Indications:              Screening for colorectal malignant neoplasm, This                            is the patient's first colonoscopy Medicines:                Monitored Anesthesia Care Procedure:                Pre-Anesthesia Assessment:                           - Prior to the procedure, a History and Physical                            was performed, and patient medications and                            allergies were reviewed. The patient's tolerance of                            previous anesthesia was also reviewed. The risks                            and benefits of the procedure and the sedation                            options and risks were discussed with the patient.                            All questions were answered, and informed consent                            was obtained. Prior Anticoagulants: The patient has                            taken no previous anticoagulant or antiplatelet                            agents. ASA Grade Assessment: II - A patient with                            mild systemic disease. After reviewing the risks                            and benefits, the patient was deemed in                            satisfactory condition to undergo the procedure.  After obtaining informed consent, the colonoscope                            was passed under direct vision. Throughout the                            procedure, the patient's blood pressure, pulse, and                            oxygen saturations were monitored continuously. The                            Olympus PCF-[id number] (#[id number]) Colonoscope was                            introduced through  the anus and advanced to the the                            cecum, identified by appendiceal orifice and                            ileocecal valve. The colonoscopy was performed                            without difficulty. The patient tolerated the                            procedure well. The quality of the bowel                            preparation was good. The ileocecal valve,                            appendiceal orifice, and rectum were photographed. Scope In: 3:28:01 PM Scope Out: 3:59:37 PM Scope Withdrawal Time: 0 hours 22 minutes 19 seconds  Total Procedure Duration: 0 hours 31 minutes 36 seconds  Findings:                 The perianal and digital rectal examinations were                            normal.                           A 4 mm polyp was found in the cecum. The polyp was                            sessile. The polyp was removed with a cold snare.                            Resection and retrieval were complete.                           Three sessile polyps were found in the ascending  colon. The polyps were 1 to 4 mm in size. These                            polyps were removed with a cold snare. Resection                            and retrieval were complete.                           A 4 mm polyp was found in the hepatic flexure. The                            polyp was sessile. The polyp was removed with a                            cold snare. Resection and retrieval were complete.                           A 3 mm polyp was found in the transverse colon. The                            polyp was sessile. The polyp was removed with a                            cold snare. Resection and retrieval were complete.                           Two sessile polyps were found in the sigmoid colon                            that were adenomatous in appearance. The polyps                            were 5 to 6 mm in size. These polyps were  removed                            with a cold snare. Resection and retrieval were                            complete.                           Multiple flat polyps noted in the left colon,                            grossly consistent with benign hyperplastic polyps.                            Three of the largest of these flat polyps, 4 to 5                            mm in size, were removed with  a cold snare as a                            representative sample. Resection and retrieval were                            complete.                           Multiple small-mouthed diverticula were found in                            the transverse colon and left colon.                           Internal hemorrhoids were found during retroflexion.                           The exam was otherwise without abnormality. Complications:            No immediate complications. Estimated blood loss:                            Minimal. Estimated Blood Loss:     Estimated blood loss was minimal. Impression:               - One 4 mm polyp in the cecum, removed with a cold                            snare. Resected and retrieved.                           - Three 1 to 4 mm polyps in the ascending colon,                            removed with a cold snare. Resected and retrieved.                           - One 4 mm polyp at the hepatic flexure, removed                            with a cold snare. Resected and retrieved.                           - One 3 mm polyp in the transverse colon, removed                            with a cold snare. Resected and retrieved.                           - Two 5 to 6 mm polyps in the sigmoid colon,                            adenomatous in appearance, removed with a cold  snare. Resected and retrieved.                           - Multiple suspected left sided benign hyperplastic                            polyps. Three representative polyps  removed with a                            cold snare. Resected and retrieved.                           - Diverticulosis in the transverse colon and in the                            left colon.                           - Internal hemorrhoids.                           - The examination was otherwise normal. Recommendation:           - Patient has a contact number available for                            emergencies. The signs and symptoms of potential                            delayed complications were discussed with the                            patient. Return to normal activities tomorrow.                            Written discharge instructions were provided to the                            patient.                           - Resume previous diet.                           - Continue present medications.                           - Await pathology results with further                            recommendations. Viviann Spare P. Eero Dini, MD 05/25/2020 4:09:20 PM This report has been signed electronically.

## 2020-05-25 NOTE — Progress Notes (Signed)
Medical history reviewed with no changes noted. VS assessed by C.W 

## 2020-05-25 NOTE — Progress Notes (Signed)
A and O x3. Report to RN. Tolerated MAC anesthesia well.

## 2020-05-25 NOTE — Progress Notes (Signed)
Called to room to assist during endoscopic procedure.  Patient ID and intended procedure confirmed with present staff. Received instructions for my participation in the procedure from the performing physician.  

## 2020-05-25 NOTE — Patient Instructions (Signed)
Please, read all of the handouts given to you by your recovery room nurse.  YOU HAD AN ENDOSCOPIC PROCEDURE TODAY AT THE [facility]:   Refer to the procedure report that was given to you for any specific questions about what was found during the examination.  If the procedure report does not answer your questions, please call your gastroenterologist to clarify.  If you requested that your care partner not be given the details of your procedure findings, then the procedure report has been included in a sealed envelope for you to review at your convenience later.  YOU SHOULD EXPECT: Some feelings of bloating in the abdomen. Passage of more gas than usual.  Walking can help get rid of the air that was put into your GI tract during the procedure and reduce the bloating. If you had a lower endoscopy (such as a colonoscopy or flexible sigmoidoscopy) you may notice spotting of blood in your stool or on the toilet paper. If you underwent a bowel prep for your procedure, you may not have a normal bowel movement for a few days.  Please Note:  You might notice some irritation and congestion in your nose or some drainage.  This is from the oxygen used during your procedure.  There is no need for concern and it should clear up in a day or so.  SYMPTOMS TO REPORT IMMEDIATELY:  Following lower endoscopy (colonoscopy or flexible sigmoidoscopy):  Excessive amounts of blood in the stool  Significant tenderness or worsening of abdominal pains  Swelling of the abdomen that is new, acute  Fever of 100F or higher   For urgent or emergent issues, a gastroenterologist can be reached at any hour by calling [phone]. Do not use MyChart messaging for urgent concerns.    DIET:  We do recommend a small meal at first, but then you may proceed to your regular diet.  Drink plenty of fluids but you should avoid alcoholic beverages for 24 hours.  Try to increase the fiber in your diet, and drink plenty of  water.  ACTIVITY:  You should plan to take it easy for the rest of today and you should NOT DRIVE or use heavy machinery until tomorrow (because of the sedation medicines used during the test).    FOLLOW UP: Our staff will call the number listed on your records 48-72 hours following your procedure to check on you and address any questions or concerns that you may have regarding the information given to you following your procedure. If we do not reach you, we will leave a message.  We will attempt to reach you two times.  During this call, we will ask if you have developed any symptoms of COVID 19. If you develop any symptoms (ie: fever, flu-like symptoms, shortness of breath, cough etc.) before then, please call [phone].  If you test positive for Covid 19 in the 2 weeks post procedure, please call and report this information to us.    If any biopsies were taken you will be contacted by phone or by letter within the next 1-3 weeks.  Please call us at [phone] if you have not heard about the biopsies in 3 weeks.    SIGNATURES/CONFIDENTIALITY: You and/or your care partner have signed paperwork which will be entered into your electronic medical record.  These signatures attest to the fact that that the information above on your After Visit Summary has been reviewed and is understood.  Full responsibility of the confidentiality   confidentiality of this discharge information lies with you and/or your care-partner. 

## 2020-05-29 ENCOUNTER — Telehealth: Payer: Self-pay | Admitting: *Deleted

## 2020-05-29 NOTE — Telephone Encounter (Signed)
  Follow up Call-  Call back number 05/25/2020  Post procedure Call Back phone  # [phone]  Permission to leave phone message Yes  Some recent data might be hidden     Patient questions:  Do you have a fever, pain , or abdominal swelling? No. Pain Score  0 *  Have you tolerated food without any problems? Yes.    Have you been able to return to your normal activities? Yes.    Do you have any questions about your discharge instructions: Diet   No. Medications  No. Follow up visit  No.  Do you have questions or concerns about your Care? No.  Actions: * If pain score is 4 or above: No action needed, pain <4.  1. Have you developed a fever since your procedure? no  2.   Have you had an respiratory symptoms (SOB or cough) since your procedure? no  3.   Have you tested positive for COVID 19 since your procedure no  4.   Have you had any family members/close contacts diagnosed with the COVID 19 since your procedure?  no   If yes to any of these questions please route to Joylene John, RN and Joella Prince, RN

## 2020-08-01 ENCOUNTER — Other Ambulatory Visit: Payer: Self-pay | Admitting: Cardiovascular Disease

## 2020-08-27 ENCOUNTER — Ambulatory Visit: Payer: 59 | Admitting: Physician Assistant

## 2020-08-27 ENCOUNTER — Encounter: Payer: Self-pay | Admitting: Physician Assistant

## 2020-08-27 ENCOUNTER — Other Ambulatory Visit: Payer: Self-pay

## 2020-08-27 VITALS — BP 124/78 | HR 83 | Ht 67.0 in | Wt 169.0 lb

## 2020-08-27 DIAGNOSIS — E782 Mixed hyperlipidemia: Secondary | ICD-10-CM | POA: Diagnosis not present

## 2020-08-27 DIAGNOSIS — I2510 Atherosclerotic heart disease of native coronary artery without angina pectoris: Secondary | ICD-10-CM | POA: Diagnosis not present

## 2020-08-27 DIAGNOSIS — I10 Essential (primary) hypertension: Secondary | ICD-10-CM | POA: Diagnosis not present

## 2020-08-27 DIAGNOSIS — Z720 Tobacco use: Secondary | ICD-10-CM

## 2020-08-27 MED ORDER — NITROGLYCERIN 0.4 MG SL SUBL: 0.4000 mg | SUBLINGUAL_TABLET | SUBLINGUAL | 3 refills | Status: DC | PRN

## 2020-08-27 MED ORDER — ATORVASTATIN CALCIUM 20 MG PO TABS: ORAL_TABLET | ORAL | 11 refills | Status: DC

## 2020-08-27 MED ORDER — METOPROLOL TARTRATE 25 MG PO TABS: ORAL_TABLET | ORAL | 11 refills | Status: DC

## 2020-08-27 NOTE — Patient Instructions (Signed)
Medication Instructions:  Your physician recommends that you continue on your current medications as directed. Please refer to the Current Medication list given to you today.  1.  All medications were filled # 30 at requested pharmacy.    *If you need a refill on your cardiac medications before your next appointment, please call your pharmacy*   Lab Work: Your physician recommends that you return for a FASTING lipid profile: Monday, 16 between 7:30 - 4:30 fasting from 12 the night before.  If you have labs (blood work) drawn today and your tests are completely normal, you will receive your results only by: [name] Kitchen MyChart Message (if you have MyChart) OR . A paper copy in the mail If you have any lab test that is abnormal or we need to change your treatment, we will call you to review the results.   Testing/Procedures: -None   Follow-Up: At [facility], you and your health needs are our priority.  As part of our continuing mission to provide you with exceptional heart care, we have created designated Provider Care Teams.  These Care Teams include your primary Cardiologist (physician) and Advanced Practice Providers (APPs -  Physician Assistants and Nurse Practitioners) who all work together to provide you with the care you need, when you need it.  We recommend signing up for the patient portal called "MyChart".  Sign up information is provided on this After Visit Summary.  MyChart is used to connect with patients for Virtual Visits (Telemedicine).  Patients are able to view lab/test results, encounter notes, upcoming appointments, etc.  Non-urgent messages can be sent to your provider as well.   To learn more about what you can do with MyChart, go to [email or web address].    Your next appointment:   1 year(s)  The format for your next appointment:   In Person  Provider:   You may see Sherren Mocha, MD or one of the following Advanced Practice Providers on your designated Care  Team:    Richardson Dopp, PA-C     Other Instructions Your physician wants you to follow-up in: 1 year with Dr. Burt Knack or Richardson Dopp, PA.  You will receive a reminder letter in the mail two months in advance. If you don't receive a letter, please call our office to schedule the follow-up appointment.

## 2020-08-27 NOTE — Progress Notes (Addendum)
Cardiology Office Note:    Date:  08/27/2020   ID:  Shelby Fitzpatrick, DOB 06/09/1965, MRN [id number]  PCP:  [facility]  Cardiologist:  Sherren Mocha, MD   Advanced Practice Provider:  Liliane Shi, PA-C Electrophysiologist:  None       Referring MD: [name], PA-C   Chief Complaint:  Follow-up (CAD)    Patient Profile:    Shelby Fitzpatrick is a 55 y.o. 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 There is normal decrease in cavity size. This is a normal stress echo study.  History of Present Illness: Ms. Berens was last seen in 4/21 by Shelby Drown, NP.  She returns for annual f/u.  She is here alone.  She continues to have occasional chest symptoms.  These have been ongoing for years, since her heart attack.  She has not had any worsening symptoms or exertional chest discomfort.  She has not had significant shortness of breath.  She has not had orthopnea, syncope or leg edema.        Past Medical History:  Diagnosis Date  . CAD (coronary artery disease)    coronary intervention and drug-eluting stent placement STEMI-2011   Cath 02/25/2010:  100% RCA (PCI with Promus DES); minor LAD and CFX (nothing more than 20%)  EF 65% at cath 02/25/2010  . Chronic headaches   . Ectopic pregnancy    (ruptured)  . Gastric ulcer   . GERD (gastroesophageal reflux disease)   . Hyperlipemia   . Hypertension   . Myocardial infarction [facility])     Current Medications: Current Meds  Medication Sig  . ALPRAZolam (XANAX) 0.25 MG tablet Take 1 tablet by mouth as needed.  [name] Kitchen aspirin 81 MG tablet Take 1 tablet (81 mg total) by mouth  daily.  . cyclobenzaprine (FLEXERIL) 10 MG tablet Take 10 mg by mouth 3 times/day as needed-between meals & bedtime for muscle spasms (lower back).  . esomeprazole (NEXIUM) 20 MG capsule Take 1 capsule (20 mg total) by mouth daily at 12 noon.  . [DISCONTINUED] atorvastatin (LIPITOR) 20 MG tablet TAKE 1 TABLET(20 MG) BY MOUTH DAILY  . [DISCONTINUED] metoprolol tartrate (LOPRESSOR) 25 MG tablet Take 1 tablet (25 mg total) by mouth 2 (two) times daily. Please keep upcoming appt in April 2022 before anymore refills. Thank you  . [DISCONTINUED] nitroGLYCERIN (NITROSTAT) 0.4 MG SL tablet Place 1 tablet (0.4 mg total) under the tongue every 5 (five) minutes as needed for chest pain.     Allergies:   Biaxin [clarithromycin] and Penicillins   Social History   Tobacco Use  . Smoking status: Current Every Day Smoker    Packs/day: 0.50    Years: 35.00    Pack years: 17.50    Types: Cigarettes    Start date: 77  . Smokeless tobacco: Never Used  Vaping Use  . Vaping Use: Never used  Substance Use Topics  . Alcohol use: Yes    Alcohol/week: 0.0 standard drinks    Comment: rare  . Drug use: No     Family Hx: The patient's family history includes Colon polyps in her sister and  sister; Coronary artery disease in her father; Heart attack in her father; Hypertension in her father, mother, and sister; Stroke in her father. There is no history of Colon cancer, Stomach cancer, Rectal cancer, Esophageal cancer, or Liver cancer.  Review of Systems  Cardiovascular: Negative for claudication.     EKGs/Labs/Other Test Reviewed:    EKG:  EKG is   ordered today.  The ekg ordered today demonstrates NSR, HR 83, normal axis, T wave changes, QTC 415, no change when compared to prior tracings  Recent Labs: No results found for requested labs within last 8760 hours.   Recent Lipid Panel Lab Results  Component Value Date/Time   CHOL 149 08/23/2019 08:29 AM   TRIG 193 (H) 08/23/2019 08:29 AM   HDL 49  08/23/2019 08:29 AM   CHOLHDL 3.0 08/23/2019 08:29 AM   CHOLHDL 2.5 07/20/2015 08:14 AM   LDLCALC 68 08/23/2019 08:29 AM      Risk Assessment/Calculations:      Physical Exam:    VS:  BP 124/78   Pulse 83   Ht '5\' 7"'  (1.702 m)   Wt 169 lb (76.7 kg)   LMP 02/24/2013   SpO2 98%   BMI 26.47 kg/m     Wt Readings from Last 3 Encounters:  08/27/20 169 lb (76.7 kg)  05/25/20 168 lb (76.2 kg)  05/11/20 168 lb 3.2 oz (76.3 kg)     Constitutional:      Appearance: Healthy appearance. Not in distress.  HENT:     Ears:     Comments: +cerumen in L canal; TM appears normal Neck:     Vascular: No carotid bruit. JVD normal.  Pulmonary:     Effort: Pulmonary effort is normal.     Breath sounds: No wheezing. No rales.  Cardiovascular:     Normal rate. Regular rhythm. Normal S1. Normal S2.     Murmurs: There is no murmur.  Edema:    Peripheral edema absent.  Abdominal:     Palpations: Abdomen is soft. There is no hepatomegaly.  Skin:    General: Skin is warm and dry.  Neurological:     Mental Status: Alert and oriented to person, place and time.     Cranial Nerves: Cranial nerves are intact.          ASSESSMENT & PLAN:    1. Coronary artery disease involving native coronary artery of native heart without angina pectoris History of inferior MI in 2011 treated with DES to the RCA.  Cardiac catheterization 2017 with patent RCA stent.  She has had chronic chest symptoms since her heart attack without significant change.  She has not had any symptoms consistent with angina.  Continue aspirin, metoprolol, atorvastatin.  Follow-up in 1 year.  2. Essential hypertension The patient's blood pressure is controlled on her current regimen.  Continue current therapy.   3. Mixed hyperlipidemia Continue atorvastatin.  Arrange follow-up c-Met, lipids.  4. Tobacco abuse We discussed the importance of quitting smoking.  I have recommended cessation.    Dispo:  Return in about 1 year  (around 08/27/2021) for Routine Follow Up, w/ Dr. Burt Knack, or Richardson Dopp, PA-C, in person.   Medication Adjustments/Labs and Tests Ordered: Current medicines are reviewed at length with the patient today.  Concerns regarding medicines are outlined above.  Tests Ordered: Orders Placed This Encounter  Procedures  . Comp Met (CMET)  . Lipid Profile  . EKG 12-Lead   Medication Changes: Meds ordered this encounter  Medications  .  nitroGLYCERIN (NITROSTAT) 0.4 MG SL tablet    Sig: Place 1 tablet (0.4 mg total) under the tongue every 5 (five) minutes as needed for chest pain.    Dispense:  25 tablet    Refill:  3  . atorvastatin (LIPITOR) 20 MG tablet    Sig: TAKE 1 TABLET(20 MG) BY MOUTH DAILY    Dispense:  30 tablet    Refill:  11  . metoprolol tartrate (LOPRESSOR) 25 MG tablet    Sig: One tablet by mouth ( 25 mg) twice daily.    Dispense:  60 tablet    Refill:  [address] Richardson Dopp, [place]  08/27/2020 4:35 PM    [facility] [place], [place], [place]  [address] Phone: [phone]; Fax: [phone]

## 2020-09-17 ENCOUNTER — Other Ambulatory Visit: Payer: Self-pay

## 2020-09-17 ENCOUNTER — Other Ambulatory Visit: Payer: 59 | Admitting: *Deleted

## 2020-09-17 DIAGNOSIS — E782 Mixed hyperlipidemia: Secondary | ICD-10-CM

## 2020-09-17 DIAGNOSIS — I10 Essential (primary) hypertension: Secondary | ICD-10-CM

## 2020-09-17 DIAGNOSIS — Z720 Tobacco use: Secondary | ICD-10-CM

## 2020-09-17 DIAGNOSIS — I2510 Atherosclerotic heart disease of native coronary artery without angina pectoris: Secondary | ICD-10-CM

## 2020-09-17 LAB — COMPREHENSIVE METABOLIC PANEL
ALT: 21 IU/L (ref 0–32)
AST: 21 IU/L (ref 0–40)
Albumin/Globulin Ratio: 2.2 (ref 1.2–2.2)
Albumin: 4.4 g/dL (ref 3.8–4.9)
Alkaline Phosphatase: 84 IU/L (ref 44–121)
BUN/Creatinine Ratio: 28 — ABNORMAL HIGH (ref 9–23)
BUN: 24 mg/dL (ref 6–24)
Bilirubin Total: 0.2 mg/dL (ref 0.0–1.2)
CO2: 22 mmol/L (ref 20–29)
Calcium: 9.6 mg/dL (ref 8.7–10.2)
Chloride: 104 mmol/L (ref 96–106)
Creatinine, Ser: 0.85 mg/dL (ref 0.57–1.00)
Globulin, Total: 2 g/dL (ref 1.5–4.5)
Glucose: 93 mg/dL (ref 65–99)
Potassium: 4.5 mmol/L (ref 3.5–5.2)
Sodium: 139 mmol/L (ref 134–144)
Total Protein: 6.4 g/dL (ref 6.0–8.5)
eGFR: 82 mL/min/{1.73_m2} (ref >59)

## 2020-09-17 LAB — LIPID PANEL
Chol/HDL Ratio: 3 ratio (ref 0.0–4.4)
Cholesterol, Total: 136 mg/dL (ref 100–199)
HDL: 46 mg/dL (ref >39)
LDL Chol Calc (NIH): 65 mg/dL (ref 0–99)
Triglycerides: 144 mg/dL (ref 0–149)
VLDL Cholesterol Cal: 25 mg/dL (ref 5–40)

## 2020-10-27 ENCOUNTER — Other Ambulatory Visit: Payer: Self-pay

## 2020-10-27 ENCOUNTER — Emergency Department (HOSPITAL_BASED_OUTPATIENT_CLINIC_OR_DEPARTMENT_OTHER): Payer: 59

## 2020-10-27 ENCOUNTER — Encounter (HOSPITAL_BASED_OUTPATIENT_CLINIC_OR_DEPARTMENT_OTHER): Payer: Self-pay | Admitting: *Deleted

## 2020-10-27 ENCOUNTER — Emergency Department (HOSPITAL_BASED_OUTPATIENT_CLINIC_OR_DEPARTMENT_OTHER)
Admission: EM | Admit: 2020-10-27 | Discharge: 2020-10-27 | Disposition: A | Discharge status: Home / Self Care | Payer: 59 | Attending: Emergency Medicine | Admitting: Emergency Medicine

## 2020-10-27 DIAGNOSIS — R070 Pain in throat: Secondary | ICD-10-CM | POA: Insufficient documentation

## 2020-10-27 DIAGNOSIS — Z7982 Long term (current) use of aspirin: Secondary | ICD-10-CM | POA: Diagnosis not present

## 2020-10-27 DIAGNOSIS — R000 Tachycardia, unspecified: Secondary | ICD-10-CM | POA: Insufficient documentation

## 2020-10-27 DIAGNOSIS — F17210 Nicotine dependence, cigarettes, uncomplicated: Secondary | ICD-10-CM | POA: Insufficient documentation

## 2020-10-27 DIAGNOSIS — Z79899 Other long term (current) drug therapy: Secondary | ICD-10-CM | POA: Diagnosis not present

## 2020-10-27 DIAGNOSIS — R002 Palpitations: Secondary | ICD-10-CM | POA: Diagnosis not present

## 2020-10-27 DIAGNOSIS — Z951 Presence of aortocoronary bypass graft: Secondary | ICD-10-CM | POA: Diagnosis not present

## 2020-10-27 DIAGNOSIS — I10 Essential (primary) hypertension: Secondary | ICD-10-CM | POA: Diagnosis not present

## 2020-10-27 DIAGNOSIS — I2510 Atherosclerotic heart disease of native coronary artery without angina pectoris: Secondary | ICD-10-CM | POA: Diagnosis not present

## 2020-10-27 LAB — COMPREHENSIVE METABOLIC PANEL
ALT: 19 U/L (ref 0–44)
AST: 23 U/L (ref 15–41)
Albumin: 4.2 g/dL (ref 3.5–5.0)
Alkaline Phosphatase: 71 U/L (ref 38–126)
Anion gap: 8 (ref 5–15)
BUN: 20 mg/dL (ref 6–20)
CO2: 26 mmol/L (ref 22–32)
Calcium: 9.5 mg/dL (ref 8.9–10.3)
Chloride: 103 mmol/L (ref 98–111)
Creatinine, Ser: 1 mg/dL (ref 0.44–1.00)
GFR, Estimated: >60 mL/min (ref >60)
Glucose, Bld: 114 mg/dL — ABNORMAL HIGH (ref 70–99)
Potassium: 3.6 mmol/L (ref 3.5–5.1)
Sodium: 137 mmol/L (ref 135–145)
Total Bilirubin: 0.4 mg/dL (ref 0.3–1.2)
Total Protein: 7.4 g/dL (ref 6.5–8.1)

## 2020-10-27 LAB — CBC WITH DIFFERENTIAL/PLATELET
Abs Immature Granulocytes: 0.02 10*3/uL (ref 0.00–0.07)
Basophils Absolute: 0.1 10*3/uL (ref 0.0–0.1)
Basophils Relative: 1 %
Eosinophils Absolute: 0.2 10*3/uL (ref 0.0–0.5)
Eosinophils Relative: 3 %
HCT: 44.1 % (ref 36.0–46.0)
Hemoglobin: 14.4 g/dL (ref 12.0–15.0)
Immature Granulocytes: 0 %
Lymphocytes Relative: 36 %
Lymphs Abs: 3.1 10*3/uL (ref 0.7–4.0)
MCH: 31.2 pg (ref 26.0–34.0)
MCHC: 32.7 g/dL (ref 30.0–36.0)
MCV: 95.5 fL (ref 80.0–100.0)
Monocytes Absolute: 0.7 10*3/uL (ref 0.1–1.0)
Monocytes Relative: 9 %
Neutro Abs: 4.4 10*3/uL (ref 1.7–7.7)
Neutrophils Relative %: 51 %
Platelets: 257 10*3/uL (ref 150–400)
RBC: 4.62 MIL/uL (ref 3.87–5.11)
RDW: 12.6 % (ref 11.5–15.5)
WBC: 8.5 10*3/uL (ref 4.0–10.5)
nRBC: 0 % (ref 0.0–0.2)

## 2020-10-27 LAB — TROPONIN I (HIGH SENSITIVITY): Troponin I (High Sensitivity): 4 ng/L (ref <18)

## 2020-10-27 LAB — D-DIMER, QUANTITATIVE: D-Dimer, Quant: <0.27 ug/mL-FEU (ref 0.00–0.50)

## 2020-10-27 NOTE — ED Triage Notes (Addendum)
Pt reports palpitations since last night with "burning sensation in back of throat". Hx of MI in 2011. Denies chest pain at present. HR 104, O2 sats 99% on room air

## 2020-10-27 NOTE — ED Provider Notes (Addendum)
[facility] EMERGENCY DEPARTMENT Provider Note   CSN: [id number] Arrival date & time: 10/27/20  2004     History Chief Complaint  Patient presents with   Palpitations    Shelby Fitzpatrick is a 55 y.o. female.  55 year old female with past medical history including CAD s/p stenting, GERD, HTN, HLD, ectopic pregnancy, PUD who p/w throat burning and palpitations.  Yesterday evening after eating a hamburger for dinner, patient began having burning sensation in the back of her throat.  She took Nexium last night, Tums early this morning, and Nexium again today.  She states this did not feel like her usual heartburn because her usual heartburn response to medications.  Eventually the burning stopped this afternoon but this evening when she got up and started moving around, she began having heart palpitations.  She denies any associated chest pain, shortness of breath, nausea/vomiting, or diaphoresis.  She denies any recent illness.  No recent changes to medications and she is compliant with medicines.  The history is provided by the patient.  Palpitations     Past Medical History:  Diagnosis Date   CAD (coronary artery disease)    coronary intervention and drug-eluting stent placement STEMI-2011   Cath 02/25/2010:  100% RCA (PCI with Promus DES); minor LAD and CFX (nothing more than 20%)  EF 65% at cath 02/25/2010   Chronic headaches    Ectopic pregnancy    (ruptured)   Gastric ulcer    GERD (gastroesophageal reflux disease)    Hyperlipemia    Hypertension    Myocardial infarction [facility])     Patient Active Problem List   Diagnosis Date Noted   Essential hypertension 09/29/2017   Constipation 05/09/2016   Abdominal pain, epigastric 05/09/2016   NSAID long-term use 05/09/2016   Nausea without vomiting 05/09/2016   Angina pectoris ([place])    Nicotine addiction 01/18/2012   Hyperlipidemia 03/13/2010   MYOCARDIAL INFARCTION, INFERIOR WALL 03/13/2010   CAD (coronary artery  disease) 03/13/2010    Past Surgical History:  Procedure Laterality Date   ANKLE SURGERY     broken and had surgery to reapair   APPENDECTOMY     at age 76   [place] N/A 05/29/2015   Procedure: Left Heart Cath and Coronary Angiography;  Surgeon: Jettie Booze, MD;  Location: [facility];  Service: Cardiovascular;  Laterality: N/A;   coronary intervention      drug-eluting stent placement-2011   tubal pregnancy x2     cant recall year     OB History   No obstetric history on file.     Family History  Problem Relation Age of Onset   Coronary artery disease Father    Heart attack Father    Hypertension Father    Stroke Father    Hypertension Mother    Hypertension Sister    Colon polyps Sister    Colon polyps Sister    Colon cancer Neg Hx    Stomach cancer Neg Hx    Rectal cancer Neg Hx    Esophageal cancer Neg Hx    Liver cancer Neg Hx     Social History   Tobacco Use   Smoking status: Every Day    Packs/day: 0.50    Years: 35.00    Pack years: 17.50    Types: Cigarettes    Start date: 1984   Smokeless tobacco: Never  Vaping Use   Vaping Use: Every day  Substance Use Topics   Alcohol use:  Yes    Alcohol/week: 0.0 standard drinks    Comment: rare   Drug use: No    Home Medications Prior to Admission medications   Medication Sig Start Date End Date Taking? Authorizing Provider  ALPRAZolam Duanne Moron) 0.25 MG tablet Take 1 tablet by mouth as needed.    [provider]  aspirin 81 MG tablet Take 1 tablet (81 mg total) by mouth daily. 07/29/10   Sherren Mocha, MD  atorvastatin (LIPITOR) 20 MG tablet TAKE 1 TABLET(20 MG) BY MOUTH DAILY 08/27/20   Richardson Dopp T, PA-C  cyclobenzaprine (FLEXERIL) 10 MG tablet Take 10 mg by mouth 3 times/day as needed-between meals & bedtime for muscle spasms (lower back).    [provider]  esomeprazole (NEXIUM) 20 MG capsule Take 1 capsule (20 mg total) by mouth daily at 12 noon.  05/11/20   Armbruster, Carlota Raspberry, MD  metoprolol tartrate (LOPRESSOR) 25 MG tablet One tablet by mouth ( 25 mg) twice daily. 08/27/20   Richardson Dopp T, PA-C  nitroGLYCERIN (NITROSTAT) 0.4 MG SL tablet Place 1 tablet (0.4 mg total) under the tongue every 5 (five) minutes as needed for chest pain. 08/27/20   Richardson Dopp T, PA-C    Allergies    Biaxin [clarithromycin] and Penicillins  Review of Systems   Review of Systems  Cardiovascular:  Positive for palpitations.  All other systems reviewed and are negative except that which was mentioned in HPI  Physical Exam Updated Vital Signs BP 130/81   Pulse 78   Temp 98.3 F (36.8 C)   Resp 11   Ht 5\' 7"  (1.702 m)   Wt 75.8 kg   LMP 02/24/2013   SpO2 96%   BMI 26.16 kg/m   Physical Exam Vitals and nursing note reviewed.  Constitutional:      General: She is not in acute distress.    Appearance: Normal appearance.  HENT:     Head: Normocephalic and atraumatic.  Eyes:     Conjunctiva/sclera: Conjunctivae normal.  Cardiovascular:     Rate and Rhythm: Normal rate and regular rhythm.     Heart sounds: Normal heart sounds. No murmur heard. Pulmonary:     Effort: Pulmonary effort is normal.     Breath sounds: Normal breath sounds.  Abdominal:     General: Abdomen is flat. Bowel sounds are normal. There is no distension.     Palpations: Abdomen is soft.     Tenderness: There is no abdominal tenderness.  Musculoskeletal:     Right lower leg: No edema.     Left lower leg: No edema.  Skin:    General: Skin is warm and dry.  Neurological:     Mental Status: She is alert and oriented to person, place, and time.     Comments: fluent  Psychiatric:        Mood and Affect: Mood normal.        Behavior: Behavior normal.    ED Results / Procedures / Treatments   Labs (all labs ordered are listed, but only abnormal results are displayed) Labs Reviewed  COMPREHENSIVE METABOLIC PANEL - Abnormal; Notable for the following components:       Result Value   Glucose, Bld 114 (*)    All other components within normal limits  CBC WITH DIFFERENTIAL/PLATELET  D-DIMER, QUANTITATIVE  TROPONIN I (HIGH SENSITIVITY)  TROPONIN I (HIGH SENSITIVITY)    EKG EKG Interpretation  Date/Time:  Saturday October 27 2020 20:13:14 EDT Ventricular Rate:  109 PR  Interval:  132 QRS Duration: 74 QT Interval:  342 QTC Calculation: 460 R Axis:   19 Text Interpretation: Sinus tachycardia Possible Left atrial enlargement Nonspecific ST and T wave abnormality Abnormal ECG borderline inferior ST depression and tachycardia new from previous Confirmed by Theotis Burrow [phone]) on 10/27/2020 8:24:45 PM  Radiology DG Chest 2 View  Result Date: 10/27/2020 CLINICAL DATA:  Palpitations, history of CAD EXAM: CHEST - 2 VIEW COMPARISON:  Radiograph 06/17/2017 FINDINGS: No consolidation, features of edema, pneumothorax, or effusion. Pulmonary vascularity is normally distributed. The cardiomediastinal contours are unremarkable. No acute osseous or soft tissue abnormality. Telemetry leads overlie the chest. IMPRESSION: No acute cardiopulmonary abnormality. Electronically Signed   By: Lovena Le M.D.   On: 10/27/2020 23:37    Procedures Procedures   Medications Ordered in ED Medications - No data to display  ED Course  I have reviewed the triage vital signs and the nursing notes.  Pertinent labs & imaging results that were available during my care of the patient were reviewed by me and considered in my medical decision making (see chart for details).    MDM Rules/Calculators/A&P                          Well-appearing on exam, hypertensive at 175/80.  Initial EKG showed sinus tachycardia, no ST elevation.  Her throat burning sensation and palpitations with no other associated symptoms are not highly suggestive of ACS however given her cardiac history, obtained troponin and chest x-ray. CXR normal.  Labs including trop and d-dimer are reassuring.  She was  initially hypertensive but all vital signs normalized during ED course without intervention.  She discussed the possibility that anxiety might be playing a role in her symptoms as she admits she has been more anxious lately.  Regarding her throat burning sensation, she also notes that her Nexium dose was decreased a month ago and since then she has been having worsening heartburn symptoms.  Recommended that she go back up to her previous dose and follow-up with her PCP for further discussion.  Regarding palpitations, instructed to contact cardiology clinic for further eval.  I have extensively reviewed return precautions with her and she voiced understanding. Final Clinical Impression(s) / ED Diagnoses Final diagnoses:  Palpitations  Burning sensation of throat    Rx / DC Orders ED Discharge Orders     None        Arli Bree, Wenda Overland, MD 10/27/20 [id number]    Rex Kras Wenda Overland, MD 10/27/20 2340

## 2020-10-27 NOTE — Discharge Instructions (Addendum)
Please increase Nexium to your previous dosage and follow up with your primary care provider about your reflux.  Contact cardiology for follow up regarding your palpitations.

## 2020-11-18 NOTE — H&P (View-Only) (Signed)
Cardiology Office Note:    Date:  11/19/2020   ID:  Shelby Fitzpatrick, DOB Oct 09, 1965, MRN [id number]  PCP:  Shelby Fitzpatrick   [facility] Providers Cardiologist:  Shelby Mocha, MD Cardiology APP:  Shelby Fitzpatrick      Referring MD: No ref. provider found   Chief Complaint:  Hospitalization Follow-up (ED visit with throat burning and palpitations)    Patient Profile:    Shelby Fitzpatrick is a 55 y.o. female with:  Coronary artery disease Inferior MI in 2011 s/p DES to RCA Cath 2017: RCA stent patent Hypertension Hyperlipidemia    Prior CV studies: Cardiac Catheterization 05-31-2015 LAD mid myocardial bridging RCA prox 20, stent patent EF 55-65   Stress Echo 08/07/10 - With stress there is normal increase in thickening and   contractility. There is normal decrease in cavity size. This is a   normal stress echo study.   History of Present Illness: Shelby Fitzpatrick was last seen in April 2022.  She presented to the emergency room 10/27/2020 with symptoms of throat burning and palpitations.  Her high-sensitivity troponin was normal.  D-dimer was normal.  Chest x-ray was unrevealing.  She was instructed to increase her PPI back to its usual dose as her symptoms started when her dose was decreased.  She was asked to follow-up with cardiology for her palpitations.      She is here alone. Since she went to the emergency room, she has not had further palpitations. She has also not had further throat burning. She has not had chest discomfort with exertion. She has not had significant shortness of breath. She has not had orthopnea, leg edema or syncope. She continues to smoke cigarettes.     Past Medical History:  Diagnosis Date   CAD (coronary artery disease)    coronary intervention and drug-eluting stent placement STEMI-2011   Cath 02/25/2010:  100% RCA (PCI with Promus DES); minor LAD and CFX (nothing more than 20%)  EF 65% at cath 02/25/2010   Chronic headaches    Ectopic pregnancy     (ruptured)   Gastric ulcer    GERD (gastroesophageal reflux disease)    Hyperlipemia    Hypertension    Myocardial infarction ([place])     Current Medications: Current Meds  Medication Sig   ALPRAZolam (XANAX) 0.25 MG tablet Take 1 tablet by mouth as needed.   aspirin 81 MG tablet Take 1 tablet (81 mg total) by mouth daily.   atorvastatin (LIPITOR) 20 MG tablet TAKE 1 TABLET(20 MG) BY MOUTH DAILY   cyclobenzaprine (FLEXERIL) 10 MG tablet Take 10 mg by mouth 3 times/day as needed-between meals & bedtime for muscle spasms (lower back).   esomeprazole (NEXIUM) 20 MG capsule Take 1 capsule (20 mg total) by mouth daily at 12 noon.   metoprolol tartrate (LOPRESSOR) 25 MG tablet One tablet by mouth ( 25 mg) twice daily.   nitroGLYCERIN (NITROSTAT) 0.4 MG SL tablet Place 1 tablet (0.4 mg total) under the tongue every 5 (five) minutes as needed for chest pain.     Allergies:   Biaxin [clarithromycin] and Penicillins   Social History   Tobacco Use   Smoking status: Every Day    Packs/day: 0.50    Years: 35.00    Pack years: 17.50    Types: Cigarettes    Start date: 1984   Smokeless tobacco: Never  Vaping Use   Vaping Use: Every day  Substance Use Topics   Alcohol use: Yes  Alcohol/week: 0.0 standard drinks    Comment: rare   Drug use: No     Family Hx: The patient's family history includes Colon polyps in her sister and sister; Coronary artery disease in her father; Heart attack in her father; Hypertension in her father, mother, and sister; Stroke in her father. There is no history of Colon cancer, Stomach cancer, Rectal cancer, Esophageal cancer, or Liver cancer.  ROS No claudication She does have some left knee pain   EKGs/Labs/Other Test Reviewed:    EKG:  EKG is   ordered today.  EKG: NSR, HR 62, normal axis, T wave inversions 2, 3, aVF, V5-V6, septal Q waves, QTC 418  Recent Labs: 10/27/2020: ALT 19; BUN 20; Creatinine, Ser 1.00; Hemoglobin 14.4; Platelets 257;  Potassium 3.6; Sodium 137   Recent Lipid Panel Lab Results  Component Value Date/Time   CHOL 136 09/17/2020 07:41 AM   TRIG 144 09/17/2020 07:41 AM   HDL 46 09/17/2020 07:41 AM   LDLCALC 65 09/17/2020 07:41 AM      Risk Assessment/Calculations:      Physical Exam:    VS:  BP 140/70   Pulse 62   Ht 5\' 7"  (1.702 m)   Wt 168 lb 6.4 oz (76.4 kg)   LMP 02/24/2013   SpO2 97%   BMI 26.38 kg/m     Wt Readings from Last 3 Encounters:  11/19/20 168 lb 6.4 oz (76.4 kg)  10/27/20 167 lb (75.8 kg)  08/27/20 169 lb (76.7 kg)     Gen: No acute distress Neck: no JVD Cardiac: Regular rate and rhythm without murmur Lungs: Clear to auscultation bilaterally without wheezing or rales Abdomen: soft nontender Extremities: no leg edema bilat  Neuro:  no deficits noted, alert and oriented x 3 Skin: warm and dry      ASSESSMENT & PLAN:    Coronary artery disease History of inferior MI in 2011 treated the DES to the RCA. Cardiac catheterization 2017 demonstrated patent RCA stent and no significant disease elsewhere. She recently presented to the emergency room with palpitations. She has not had any further palpitations. She did have throat burning but her cardiac enzymes were negative. She does have inferolateral T wave inversions on EKG. She is not having exertional or unstable symptoms. However, it has been several years since her last assessment for ischemia. Given her recent visit to the emergency room and EKG changes, I have recommended proceeding with stress Myoview. Risk and benefits have been discussed with the patient and she agrees to proceed. Continue current dose of aspirin, atorvastatin, metoprolol.  Follow-up with me or Dr. Burt Fitzpatrick in 6 months. She will return sooner if her stress test is abnormal.  Hypertension Blood pressure somewhat above target. It has usually been controlled in the past. I have asked her to monitor her blood pressure over the next week and send in those  readings for review over MyChart. If her blood pressure remains above target, I will add amlodipine to her medications.  Palpitations She has not had a recurrence. 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 We discussed proceeding with an event monitor now versus watchful waiting. She prefers to wait. I have encouraged her to try to get an EKG from her apple watch if she has recurrent symptoms. I have also advised her to take an extra metoprolol tartrate 25 mg as needed for rapid palpitations.  Hyperlipidemia Continue atorvastatin 20 mg daily.  Tobacco abuse I have again recommended smoking cessation.  Shared Decision Making/Informed Consent The risks [chest pain, shortness of breath, cardiac arrhythmias, dizziness, blood pressure fluctuations, myocardial infarction, stroke/transient ischemic attack, nausea, vomiting, allergic reaction, radiation exposure, metallic taste sensation and life-threatening complications (estimated to be 1 in 10,000)], benefits (risk stratification, diagnosing coronary artery disease, treatment guidance) and alternatives of a nuclear stress test were discussed in detail with Shelby Fitzpatrick and she agrees to proceed.    Dispo:  Return in about 6 months (around 05/22/2021) for Routine follow up in 6 months with Dr. Burt Fitzpatrick or Richardson Dopp, PA-C..   Medication Adjustments/Labs and Tests Ordered: Current medicines are reviewed at length with the patient today.  Concerns regarding medicines are outlined above.  Tests Ordered: Orders Placed This Encounter  Procedures   Cardiac Stress Test: Informed Consent Details: Physician/Practitioner Attestation; Transcribe to consent form and obtain patient signature   Myocardial Perfusion Imaging   EKG 12-Lead   Medication Changes: No orders of the defined types were placed in this encounter.   Signed, Richardson Dopp, PA-C  11/19/2020 2:05 PM    [facility] [address], [place], [place]  [address] Phone: [phone]; Fax: [phone]   Addendum 11/21/2020 5:09 PM  The patient had significant reservations about proceeding with stress testing.  Due to her concerns, she reached out to Dr. Burt Fitzpatrick who spoke with her by phone today.  After further discussion and shared decision making, the decision was made to proceed with cardiac catheterization.  Her stress test has been canceled.  Cardiac catheterization will be performed with Dr. Burt Fitzpatrick on 12/05/2020.  Please see phone note from today. Richardson Dopp, PA-C 7/20/20225:10 PM

## 2020-11-18 NOTE — Progress Notes (Addendum)
Cardiology Office Note:    Date:  11/19/2020   ID:  Shelby Fitzpatrick, DOB 08-19-1965, MRN [id number]  PCP:  Kathyrn Lass   [facility] Providers Cardiologist:  Sherren Mocha, MD Cardiology APP:  Sharmon Revere      Referring MD: No ref. provider found   Chief Complaint:  Hospitalization Follow-up (ED visit with throat burning and palpitations)    Patient Profile:    Shelby Fitzpatrick is a 55 y.o. 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 There is normal decrease in cavity size. This is a   normal stress echo study.   History of Present Illness: Shelby Fitzpatrick was last seen in April 2022.  She presented to the emergency room 10/27/2020 with symptoms of throat burning and palpitations.  Her high-sensitivity troponin was normal.  D-dimer was normal.  Chest x-ray was unrevealing.  She was instructed to increase her PPI back to its usual dose as her symptoms started when her dose was decreased.  She was asked to follow-up with cardiology for her palpitations.      She is here alone. Since she went to the emergency room, she has not had further palpitations. She has also not had further throat burning. She has not had chest discomfort with exertion. She has not had significant shortness of breath. She has not had orthopnea, leg edema or syncope. She continues to smoke cigarettes.     Past Medical History:  Diagnosis Date   CAD (coronary artery disease)    coronary intervention and drug-eluting stent placement STEMI-2011   Cath 02/25/2010:  100% RCA (PCI with Promus DES); minor LAD and CFX (nothing more than 20%)  EF 65% at cath 02/25/2010   Chronic headaches    Ectopic pregnancy     (ruptured)   Gastric ulcer    GERD (gastroesophageal reflux disease)    Hyperlipemia    Hypertension    Myocardial infarction ([place])     Current Medications: Current Meds  Medication Sig   ALPRAZolam (XANAX) 0.25 MG tablet Take 1 tablet by mouth as needed.   aspirin 81 MG tablet Take 1 tablet (81 mg total) by mouth daily.   atorvastatin (LIPITOR) 20 MG tablet TAKE 1 TABLET(20 MG) BY MOUTH DAILY   cyclobenzaprine (FLEXERIL) 10 MG tablet Take 10 mg by mouth 3 times/day as needed-between meals & bedtime for muscle spasms (lower back).   esomeprazole (NEXIUM) 20 MG capsule Take 1 capsule (20 mg total) by mouth daily at 12 noon.   metoprolol tartrate (LOPRESSOR) 25 MG tablet One tablet by mouth ( 25 mg) twice daily.   nitroGLYCERIN (NITROSTAT) 0.4 MG SL tablet Place 1 tablet (0.4 mg total) under the tongue every 5 (five) minutes as needed for chest pain.     Allergies:   Biaxin [clarithromycin] and Penicillins   Social History   Tobacco Use   Smoking status: Every Day    Packs/day: 0.50    Years: 35.00    Pack years: 17.50    Types: Cigarettes    Start date: 1984   Smokeless tobacco: Never  Vaping Use   Vaping Use: Every day  Substance Use Topics   Alcohol use: Yes  Alcohol/week: 0.0 standard drinks    Comment: rare   Drug use: No     Family Hx: The patient's family history includes Colon polyps in her sister and sister; Coronary artery disease in her father; Heart attack in her father; Hypertension in her father, mother, and sister; Stroke in her father. There is no history of Colon cancer, Stomach cancer, Rectal cancer, Esophageal cancer, or Liver cancer.  ROS No claudication She does have some left knee pain   EKGs/Labs/Other Test Reviewed:    EKG:  EKG is   ordered today.  EKG: NSR, HR 62, normal axis, T wave inversions 2, 3, aVF, V5-V6, septal Q waves, QTC 418  Recent Labs: 10/27/2020: ALT 19; BUN 20; Creatinine, Ser 1.00; Hemoglobin 14.4; Platelets 257;  Potassium 3.6; Sodium 137   Recent Lipid Panel Lab Results  Component Value Date/Time   CHOL 136 09/17/2020 07:41 AM   TRIG 144 09/17/2020 07:41 AM   HDL 46 09/17/2020 07:41 AM   LDLCALC 65 09/17/2020 07:41 AM      Risk Assessment/Calculations:      Physical Exam:    VS:  BP 140/70   Pulse 62   Ht 5\' 7"  (1.702 m)   Wt 168 lb 6.4 oz (76.4 kg)   LMP 02/24/2013   SpO2 97%   BMI 26.38 kg/m     Wt Readings from Last 3 Encounters:  11/19/20 168 lb 6.4 oz (76.4 kg)  10/27/20 167 lb (75.8 kg)  08/27/20 169 lb (76.7 kg)     Gen: No acute distress Neck: no JVD Cardiac: Regular rate and rhythm without murmur Lungs: Clear to auscultation bilaterally without wheezing or rales Abdomen: soft nontender Extremities: no leg edema bilat  Neuro:  no deficits noted, alert and oriented x 3 Skin: warm and dry      ASSESSMENT & PLAN:    Coronary artery disease History of inferior MI in 2011 treated the DES to the RCA. Cardiac catheterization 2017 demonstrated patent RCA stent and no significant disease elsewhere. She recently presented to the emergency room with palpitations. She has not had any further palpitations. She did have throat burning but her cardiac enzymes were negative. She does have inferolateral T wave inversions on EKG. She is not having exertional or unstable symptoms. However, it has been several years since her last assessment for ischemia. Given her recent visit to the emergency room and EKG changes, I have recommended proceeding with stress Myoview. Risk and benefits have been discussed with the patient and she agrees to proceed. Continue current dose of aspirin, atorvastatin, metoprolol.  Follow-up with me or Dr. Burt Knack in 6 months. She will return sooner if her stress test is abnormal.  Hypertension Blood pressure somewhat above target. It has usually been controlled in the past. I have asked her to monitor her blood pressure over the next week and send in those  readings for review over MyChart. If her blood pressure remains above target, I will add amlodipine to her medications.  Palpitations She has not had a recurrence. 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 We discussed proceeding with an event monitor now versus watchful waiting. She prefers to wait. I have encouraged her to try to get an EKG from her apple watch if she has recurrent symptoms. I have also advised her to take an extra metoprolol tartrate 25 mg as needed for rapid palpitations.  Hyperlipidemia Continue atorvastatin 20 mg daily.  Tobacco abuse I have again recommended smoking cessation.  Shared Decision Making/Informed Consent The risks [chest pain, shortness of breath, cardiac arrhythmias, dizziness, blood pressure fluctuations, myocardial infarction, stroke/transient ischemic attack, nausea, vomiting, allergic reaction, radiation exposure, metallic taste sensation and life-threatening complications (estimated to be 1 in 10,000)], benefits (risk stratification, diagnosing coronary artery disease, treatment guidance) and alternatives of a nuclear stress test were discussed in detail with Ms. Marasco and she agrees to proceed.    Dispo:  Return in about 6 months (around 05/22/2021) for Routine follow up in 6 months with Dr. Burt Knack or Richardson Dopp, PA-C..   Medication Adjustments/Labs and Tests Ordered: Current medicines are reviewed at length with the patient today.  Concerns regarding medicines are outlined above.  Tests Ordered: Orders Placed This Encounter  Procedures   Cardiac Stress Test: Informed Consent Details: Physician/Practitioner Attestation; Transcribe to consent form and obtain patient signature   Myocardial Perfusion Imaging   EKG 12-Lead   Medication Changes: No orders of the defined types were placed in this encounter.   Signed, Richardson Dopp, PA-C  11/19/2020 2:05 PM    [facility] [place], [place], [place]  [address] Phone: [phone]; Fax: [phone]   Addendum 11/21/2020 5:09 PM  The patient had significant reservations about proceeding with stress testing.  Due to her concerns, she reached out to Dr. Burt Knack who spoke with her by phone today.  After further discussion and shared decision making, the decision was made to proceed with cardiac catheterization.  Her stress test has been canceled.  Cardiac catheterization will be performed with Dr. Burt Knack on 12/05/2020.  Please see phone note from today. Richardson Dopp, PA-C 7/20/20225:10 PM

## 2020-11-19 ENCOUNTER — Encounter: Payer: Self-pay | Admitting: Physician Assistant

## 2020-11-19 ENCOUNTER — Other Ambulatory Visit: Payer: Self-pay

## 2020-11-19 ENCOUNTER — Ambulatory Visit (INDEPENDENT_AMBULATORY_CARE_PROVIDER_SITE_OTHER): Payer: 59 | Admitting: Physician Assistant

## 2020-11-19 VITALS — BP 140/70 | HR 62 | Ht 67.0 in | Wt 168.4 lb

## 2020-11-19 DIAGNOSIS — E782 Mixed hyperlipidemia: Secondary | ICD-10-CM

## 2020-11-19 DIAGNOSIS — I10 Essential (primary) hypertension: Secondary | ICD-10-CM | POA: Diagnosis not present

## 2020-11-19 DIAGNOSIS — R002 Palpitations: Secondary | ICD-10-CM | POA: Diagnosis not present

## 2020-11-19 DIAGNOSIS — Z720 Tobacco use: Secondary | ICD-10-CM

## 2020-11-19 DIAGNOSIS — I2510 Atherosclerotic heart disease of native coronary artery without angina pectoris: Secondary | ICD-10-CM | POA: Diagnosis not present

## 2020-11-19 NOTE — Patient Instructions (Signed)
Medication Instructions:   Your physician recommends that you continue on your current medications as directed. Please refer to the Current Medication list given to you today.  It is ok to take extra metoprolol one tablet by mouth ( 25 mg) as needed for palpitations.   *If you need a refill on your cardiac medications before your next appointment, please call your pharmacy*   Lab Work: -NONE  If you have labs (blood work) drawn today and your tests are completely normal, you will receive your results only by: [place] (if you have MyChart) OR A paper copy in the mail If you have any lab test that is abnormal or we need to change your treatment, we will call you to review the results.   Testing/Procedures:  You are scheduled for a Myocardial Perfusion Imaging Study on Thursday, July 28 at 7:30 am.   Please arrive 15 minutes prior to your appointment time for registration and insurance purposes.   The test will take approximately 3 to 4 hours to complete; you may bring reading material. If someone comes with you to your appointment, they will need to remain in the main lobby due to limited space in the testing area.   How to prepare for your Myocardial Perfusion test:   Do not eat or drink 3 hours prior to your test, except you may have water.    Do not consume products containing caffeine (regular or decaffeinated) 12 hours prior to your test (ex: coffee, chocolate, soda, tea)   Do bring a list of your current medications with you. If not listed below, you may take your medications as normal.    Do not take Metoprolol  the am prior to the test.   Bring any held medication to your appointment, as you may be required to take it once the test is complete.   Do wear comfortable clothes (no dresses or overalls) and walking shoes. Tennis shoes are preferred. No heels or open toed shoes.  Do not wear perfume or lotions (deodorant is allowed).   If these instructions are not  followed, you test will have to be rescheduled.   Please report to [address] for your test. If you have questions or concerns about your appointment, please call the Nuclear Lab at [phone].  If you cannot keep your appointment, please provide 24 hour notification to the Nuclear lab to avoid a possible $50 charge to your account.       Follow-Up: At [facility], you and your health needs are our priority.  As part of our continuing mission to provide you with exceptional heart care, we have created designated Provider Care Teams.  These Care Teams include your primary Cardiologist (physician) and Advanced Practice Providers (APPs -  Physician Assistants and Nurse Practitioners) who all work together to provide you with the care you need, when you need it.  We recommend signing up for the patient portal called "MyChart".  Sign up information is provided on this After Visit Summary.  MyChart is used to connect with patients for Virtual Visits (Telemedicine).  Patients are able to view lab/test results, encounter notes, upcoming appointments, etc.  Non-urgent messages can be sent to your provider as well.   To learn more about what you can do with MyChart, go to [email or web address].    Your next appointment:   6 month(s)  The format for your next appointment:   In Person  Provider:   You may see Legrand Como  Burt Knack, MD or one of the following Advanced Practice Providers on your designated Care Team:   Richardson Dopp, PA-C   Other Instructions Your physician wants you to follow-up in: 6 month with Dr. Burt Knack or Richardson Dopp, PA-C.  You will receive a reminder letter in the mail two months in advance. If you don't receive a letter, please call our office to schedule the follow-up appointment.   It is ok to take extra metoprolol one tablet by mouth ( 25 mg) as needed for palpitations.   Please check blood pressure X 2 weeks and send readings to [place] either in  my chart or call [phone].

## 2020-11-19 NOTE — Progress Notes (Deleted)
She is here alone. Since she went to the emergency room, she has not had further palpitations. She has also not had further throat burning. She has not had chest discomfort with exertion. She has not had significant shortness of breath. She has not had orthopnea, leg edema or syncope. She continues to smoke cigarettes.  ROS No claudication She does have some left knee pain   Exam No acute distress Regular rate and rhythm without murmur Clear to auscultation bilaterally without wheezing or rales Abdomen soft nontender Extremities without edema Neck without JVD Neuro no deficits noted  EKG: NSR, HR 62, normal axis, T wave inversions 2, 3, aVF, V5-V6, septal Q waves, QTC 418  Coronary artery disease History of inferior MI in 2011 treated the DES to the RCA. Cardiac catheterization 2017 demonstrated patent RCA stent and no significant disease elsewhere. She recently presented to the emergency room with palpitations. She has not had any further palpitations. She did have throat burning but her cardiac enzymes were negative. She does have inferolateral T wave inversions on EKG. She is not having exertional or unstable symptoms. However, it has been several years since her last assessment for ischemia. Given her recent visit to the emergency room and EKG changes, I have recommended proceeding with stress Myoview. Risk and benefits have been discussed with the patient and she agrees to proceed. Continue current dose of aspirin, atorvastatin, metoprolol.  Hypertension Blood pressure somewhat above target. It is usually been controlled in the past. I have asked her to monitor blood pressure over the next week and send in those readings for review over MyChart. Blood pressure remains above target, I will add amlodipine to her medications.  Palpitations She has not had a recurrence. 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 We discussed proceeding with an event monitor now versus watchful waiting. She prefers to  wait. I have encouraged her to try to get an EKG from her apple watch if she has recurrent symptoms. I have also advised her to take an extra metoprolol tartrate 25 mg as needed for rapid palpitations.  Hyperlipidemia Continue atorvastatin 20 mg daily.  Tobacco abuse I have again recommended smoking cessation.  Follow-up with me or Dr. Burt Knack in 6 months. She will return sooner if her stress test is abnormal.

## 2020-11-21 ENCOUNTER — Encounter: Payer: Self-pay | Admitting: *Deleted

## 2020-11-21 ENCOUNTER — Telehealth: Payer: Self-pay | Admitting: Cardiovascular Disease

## 2020-11-21 DIAGNOSIS — I2510 Atherosclerotic heart disease of native coronary artery without angina pectoris: Secondary | ICD-10-CM

## 2020-11-21 DIAGNOSIS — R072 Precordial pain: Secondary | ICD-10-CM

## 2020-11-21 DIAGNOSIS — R079 Chest pain, unspecified: Secondary | ICD-10-CM

## 2020-11-21 NOTE — Telephone Encounter (Signed)
S/w pt stated she s/w Dr. Burt Knack and is on board with pt having heart cath and Richardson Dopp, PA-C should touch base with Dr. Burt Knack.  Pt will wait to hear back about instructions for heart cath. Will send to Dr. Burt Knack and Nicki Reaper to [facility] and advise.

## 2020-11-21 NOTE — Telephone Encounter (Signed)
Patient wanted to do the Heart Cath as discussed at her appointment with Richardson Dopp Monday 11/19/20. The patient has cancelled her stress test. Please call the patient to schedule the Heart Cath

## 2020-11-21 NOTE — Telephone Encounter (Signed)
I really did not recommend a cardiac catheterization. If her stress test comes back abnormal, she would need a cardiac catheterization.  Given her current symptoms, a stress test would be the most helpful test to determine if she needs further testing. A stress test is noninvasive and safer than a cardiac catheterization.  At this time, the risks of a cardiac catheterization outweigh the benefits. If her stress test is abnormal, then a cardiac catheterization would provide more benefit than risk. I recommend rescheduling her Myoview. Richardson Dopp, PA-C    11/21/2020 1:42 PM

## 2020-11-21 NOTE — Telephone Encounter (Signed)
S/w pt went over all instructions for [facility] on August 3 @ 11:30 with Dr. Burt Knack. Placed in mail today cath instructions. Pt is aware to have labs drawn on July 29, placed orders in system, scheduled appt and linked. Pt had no questions.  Sent to pre-cert. Will send to Dr.Cooper and Richardson Dopp, PA-C to [facility].

## 2020-11-21 NOTE — Telephone Encounter (Signed)
Spoke to the patient over the telephone.  She has decided to cancel her nuclear stress test.  She is very concerned about her symptoms which are ongoing and include heart palpitations and intermittent chest pains.  She states this is the most concern she is ever been about her heart since the time of her initial heart attack.  She prefers definitive evaluation with cardiac catheterization.  I went back and reviewed her records.  Her last heart catheterization in 2017 demonstrated patency of her RCA stent and no obstructive disease elsewhere.  I discussed the rationale for a nuclear stress test, especially in light of the fact that her cardiac biomarkers were normal at the time of her symptoms.  From a prior experience, the patient has a distrust of stress test results as her brother died of a heart attack soon after a normal stress test.  Through shared decision-making process, we discussed potential diagnostic options and she strongly prefers cardiac catheterization for definitive evaluation.  She understands that there is a low risk of serious complication related to this. I have reviewed the risks, indications, and alternatives to cardiac catheterization, possible angioplasty, and stenting with the patient. Risks include but are not limited to bleeding, infection, vascular injury, stroke, myocardial infection, arrhythmia, kidney injury, radiation-related injury in the case of prolonged fluoroscopy use, emergency cardiac surgery, and death. The patient understands the risks of serious complication is 1-2 in 2446 with diagnostic cardiac cath and 1-2% or less with angioplasty/stenting.  The patient provides full informed consent and would like to proceed.  I will try to add her onto my schedule for heart catheterization on August 3 if possible.  Danielle could you arrange? Thanks  Sherren Mocha 11/21/2020 4:19 PM

## 2020-11-29 ENCOUNTER — Encounter (HOSPITAL_COMMUNITY): Payer: Self-pay

## 2020-11-29 ENCOUNTER — Encounter (HOSPITAL_COMMUNITY): Payer: 59

## 2020-11-30 ENCOUNTER — Other Ambulatory Visit: Payer: Self-pay

## 2020-11-30 ENCOUNTER — Other Ambulatory Visit: Payer: 59 | Admitting: *Deleted

## 2020-11-30 DIAGNOSIS — R079 Chest pain, unspecified: Secondary | ICD-10-CM

## 2020-11-30 DIAGNOSIS — I2510 Atherosclerotic heart disease of native coronary artery without angina pectoris: Secondary | ICD-10-CM

## 2020-11-30 LAB — CBC
Hematocrit: 41 % (ref 34.0–46.6)
Hemoglobin: 13.7 g/dL (ref 11.1–15.9)
MCH: 30.9 pg (ref 26.6–33.0)
MCHC: 33.4 g/dL (ref 31.5–35.7)
MCV: 92 fL (ref 79–97)
Platelets: 249 10*3/uL (ref 150–450)
RBC: 4.44 x10E6/uL (ref 3.77–5.28)
RDW: 12.2 % (ref 11.7–15.4)
WBC: 8.4 10*3/uL (ref 3.4–10.8)

## 2020-11-30 LAB — BASIC METABOLIC PANEL
BUN/Creatinine Ratio: 20 (ref 9–23)
BUN: 16 mg/dL (ref 6–24)
CO2: 22 mmol/L (ref 20–29)
Calcium: 9.7 mg/dL (ref 8.7–10.2)
Chloride: 104 mmol/L (ref 96–106)
Creatinine, Ser: 0.82 mg/dL (ref 0.57–1.00)
Glucose: 116 mg/dL — ABNORMAL HIGH (ref 65–99)
Potassium: 4.2 mmol/L (ref 3.5–5.2)
Sodium: 141 mmol/L (ref 134–144)
eGFR: 85 mL/min/{1.73_m2} (ref >59)

## 2020-12-03 ENCOUNTER — Telehealth: Payer: Self-pay | Admitting: *Deleted

## 2020-12-03 NOTE — Telephone Encounter (Signed)
Cardiac catheterization scheduled at [facility] for: Wednesday December 05, 2020 11:30 [facility]) at: 9:30 AM   No solid food after midnight prior to cath, clear liquids until 5 AM day of procedure.  AM meds can be  taken pre-cath with sips of water including:   aspirin 81 mg   Confirmed patient has responsible adult to drive home post procedure and be with patient first 24 hours after arriving home.  Patients are allowed one visitor in the waiting room during the time they are at the hospital for their procedure. Both patient and visitor must wear a mask once they enter the hospital.   Patient reports does not currently have any symptoms concerning for COVID-19 and no household members with COVID-19 like illness.       Reviewed procedure/mask/visitor instructions with patient.

## 2020-12-05 ENCOUNTER — Encounter (HOSPITAL_COMMUNITY)
Admission: RE | Disposition: A | Discharge status: Home / Self Care | Payer: Self-pay | Source: Home / Self Care | Attending: Cardiovascular Disease

## 2020-12-05 ENCOUNTER — Ambulatory Visit (HOSPITAL_COMMUNITY)
Admission: RE | Admit: 2020-12-05 | Discharge: 2020-12-05 | Disposition: A | Discharge status: Home / Self Care | Payer: 59 | Attending: Cardiovascular Disease | Admitting: Cardiovascular Disease

## 2020-12-05 ENCOUNTER — Encounter (HOSPITAL_COMMUNITY): Payer: Self-pay | Admitting: Cardiovascular Disease

## 2020-12-05 ENCOUNTER — Other Ambulatory Visit: Payer: Self-pay

## 2020-12-05 DIAGNOSIS — Z880 Allergy status to penicillin: Secondary | ICD-10-CM | POA: Diagnosis not present

## 2020-12-05 DIAGNOSIS — E785 Hyperlipidemia, unspecified: Secondary | ICD-10-CM | POA: Diagnosis not present

## 2020-12-05 DIAGNOSIS — Z7982 Long term (current) use of aspirin: Secondary | ICD-10-CM | POA: Diagnosis not present

## 2020-12-05 DIAGNOSIS — Z955 Presence of coronary angioplasty implant and graft: Secondary | ICD-10-CM | POA: Diagnosis not present

## 2020-12-05 DIAGNOSIS — F17210 Nicotine dependence, cigarettes, uncomplicated: Secondary | ICD-10-CM | POA: Diagnosis not present

## 2020-12-05 DIAGNOSIS — Z8249 Family history of ischemic heart disease and other diseases of the circulatory system: Secondary | ICD-10-CM | POA: Diagnosis not present

## 2020-12-05 DIAGNOSIS — I10 Essential (primary) hypertension: Secondary | ICD-10-CM | POA: Diagnosis not present

## 2020-12-05 DIAGNOSIS — Z881 Allergy status to other antibiotic agents status: Secondary | ICD-10-CM | POA: Diagnosis not present

## 2020-12-05 DIAGNOSIS — Z79899 Other long term (current) drug therapy: Secondary | ICD-10-CM | POA: Insufficient documentation

## 2020-12-05 DIAGNOSIS — I25119 Atherosclerotic heart disease of native coronary artery with unspecified angina pectoris: Secondary | ICD-10-CM | POA: Insufficient documentation

## 2020-12-05 DIAGNOSIS — I2510 Atherosclerotic heart disease of native coronary artery without angina pectoris: Secondary | ICD-10-CM

## 2020-12-05 DIAGNOSIS — I252 Old myocardial infarction: Secondary | ICD-10-CM | POA: Diagnosis not present

## 2020-12-05 DIAGNOSIS — I209 Angina pectoris, unspecified: Secondary | ICD-10-CM | POA: Diagnosis present

## 2020-12-05 DIAGNOSIS — R072 Precordial pain: Secondary | ICD-10-CM

## 2020-12-05 HISTORY — PX: LEFT HEART CATH AND CORONARY ANGIOGRAPHY: CATH118249

## 2020-12-05 SURGERY — LEFT HEART CATH AND CORONARY ANGIOGRAPHY
Anesthesia: LOCAL

## 2020-12-05 MED ORDER — SODIUM CHLORIDE 0.9 % WEIGHT BASED INFUSION: 1.0000 mL/kg/h | INTRAVENOUS | Status: DC

## 2020-12-05 MED ORDER — SODIUM CHLORIDE 0.9 % WEIGHT BASED INFUSION
3.0000 mL/kg/h | INTRAVENOUS | Status: AC
  Administered 2020-12-05: 3 mL/kg/h via INTRAVENOUS

## 2020-12-05 MED ORDER — LIDOCAINE HCL (PF) 1 % IJ SOLN
INTRAMUSCULAR | Status: AC
  Filled 2020-12-05: qty 30

## 2020-12-05 MED ORDER — MIDAZOLAM HCL 2 MG/2ML IJ SOLN
INTRAMUSCULAR | Status: AC
  Filled 2020-12-05: qty 2

## 2020-12-05 MED ORDER — ACETAMINOPHEN 325 MG PO TABS: 650.0000 mg | ORAL_TABLET | ORAL | Status: DC | PRN

## 2020-12-05 MED ORDER — MIDAZOLAM HCL 2 MG/2ML IJ SOLN
INTRAMUSCULAR | Status: DC | PRN
  Administered 2020-12-05: 2 mg via INTRAVENOUS

## 2020-12-05 MED ORDER — HEPARIN (PORCINE) IN NACL 1000-0.9 UT/500ML-% IV SOLN
INTRAVENOUS | Status: DC | PRN
  Administered 2020-12-05 (×2): 500 mL

## 2020-12-05 MED ORDER — HEPARIN SODIUM (PORCINE) 1000 UNIT/ML IJ SOLN
INTRAMUSCULAR | Status: DC | PRN
  Administered 2020-12-05: 4000 [IU] via INTRAVENOUS

## 2020-12-05 MED ORDER — LABETALOL HCL 5 MG/ML IV SOLN: 10.0000 mg | INTRAVENOUS | Status: DC | PRN

## 2020-12-05 MED ORDER — HEPARIN SODIUM (PORCINE) 1000 UNIT/ML IJ SOLN
INTRAMUSCULAR | Status: AC
  Filled 2020-12-05: qty 1

## 2020-12-05 MED ORDER — LIDOCAINE HCL (PF) 1 % IJ SOLN
INTRAMUSCULAR | Status: DC | PRN
  Administered 2020-12-05: 2 mL

## 2020-12-05 MED ORDER — SODIUM CHLORIDE 0.9 % IV SOLN: 250.0000 mL | INTRAVENOUS | Status: DC | PRN

## 2020-12-05 MED ORDER — SODIUM CHLORIDE 0.9% FLUSH: 3.0000 mL | Freq: Two times a day (BID) | INTRAVENOUS | Status: DC

## 2020-12-05 MED ORDER — ASPIRIN 81 MG PO CHEW: 81.0000 mg | CHEWABLE_TABLET | ORAL | Status: DC

## 2020-12-05 MED ORDER — SODIUM CHLORIDE 0.9% FLUSH: 3.0000 mL | INTRAVENOUS | Status: DC | PRN

## 2020-12-05 MED ORDER — IOHEXOL 350 MG/ML SOLN
INTRAVENOUS | Status: DC | PRN
  Administered 2020-12-05: 30 mL

## 2020-12-05 MED ORDER — HYDRALAZINE HCL 20 MG/ML IJ SOLN: 10.0000 mg | INTRAMUSCULAR | Status: DC | PRN

## 2020-12-05 MED ORDER — FENTANYL CITRATE (PF) 100 MCG/2ML IJ SOLN
INTRAMUSCULAR | Status: AC
  Filled 2020-12-05: qty 2

## 2020-12-05 MED ORDER — FENTANYL CITRATE (PF) 100 MCG/2ML IJ SOLN
INTRAMUSCULAR | Status: DC | PRN
  Administered 2020-12-05: 25 ug via INTRAVENOUS

## 2020-12-05 MED ORDER — HEPARIN (PORCINE) IN NACL 1000-0.9 UT/500ML-% IV SOLN
INTRAVENOUS | Status: AC
  Filled 2020-12-05: qty 1000

## 2020-12-05 MED ORDER — ONDANSETRON HCL 4 MG/2ML IJ SOLN: 4.0000 mg | Freq: Four times a day (QID) | INTRAMUSCULAR | Status: DC | PRN

## 2020-12-05 MED ORDER — VERAPAMIL HCL 2.5 MG/ML IV SOLN
INTRAVENOUS | Status: AC
  Filled 2020-12-05: qty 2

## 2020-12-05 MED ORDER — VERAPAMIL HCL 2.5 MG/ML IV SOLN
INTRAVENOUS | Status: DC | PRN
  Administered 2020-12-05: 10 mL via INTRA_ARTERIAL

## 2020-12-05 SURGICAL SUPPLY — 9 items
CATH 5FR JL3.5 JR4 ANG PIG MP (CATHETERS) ×2 IMPLANT
DEVICE RAD TR BAND REGULAR (VASCULAR PRODUCTS) ×2 IMPLANT
GLIDESHEATH SLEND SS 6F .021 (SHEATH) ×2 IMPLANT
GUIDEWIRE INQWIRE 1.5J.035X260 (WIRE) ×1 IMPLANT
INQWIRE 1.5J .035X260CM (WIRE) ×2
KIT HEART LEFT (KITS) ×2 IMPLANT
PACK CARDIAC CATHETERIZATION (CUSTOM PROCEDURE TRAY) ×2 IMPLANT
TRANSDUCER W/STOPCOCK (MISCELLANEOUS) ×2 IMPLANT
TUBING CIL FLEX 10 FLL-RA (TUBING) ×4 IMPLANT

## 2020-12-05 NOTE — Interval H&P Note (Signed)
History and Physical Interval Note:  12/05/2020 12:12 PM  Shelby Fitzpatrick  has presented today for surgery, with the diagnosis of chest pain, CAD.  The various methods of treatment have been discussed with the patient and family. After consideration of risks, benefits and other options for treatment, the patient has consented to  Procedure(s): LEFT HEART CATH AND CORONARY ANGIOGRAPHY (N/A) as a surgical intervention.  The patient's history has been reviewed, patient examined, no change in status, stable for surgery.  I have reviewed the patient's chart and labs.  Questions were answered to the patient's satisfaction.     Sherren Mocha

## 2020-12-10 ENCOUNTER — Telehealth: Payer: Self-pay | Admitting: *Deleted

## 2020-12-10 NOTE — Telephone Encounter (Signed)
Per 11/19/20 office note-call placed to patient to arrange 6 month follow-up appointment with Dr Juliann Pulse, PA-C.  Cardiac cath done 12/05/20, patient reports she is doing fine after cardiac cath and is comfortable with 6 month follow-up appointment.  Patient aware follow-up appointment with Dr Burt Knack has been scheduled 06/17/21 2:20 PM.

## 2021-02-06 ENCOUNTER — Encounter: Payer: 59 | Admitting: Registered Nurse

## 2021-05-21 ENCOUNTER — Other Ambulatory Visit: Payer: Self-pay

## 2021-05-21 MED ORDER — ESOMEPRAZOLE MAGNESIUM 20 MG PO CPDR: 20.0000 mg | DELAYED_RELEASE_CAPSULE | Freq: Every day | ORAL | 0 refills | Status: DC

## 2021-05-21 NOTE — Progress Notes (Signed)
Refill request for Nexium 20 mg once daily - filled. Patient needs a yearly F/U appointment for further refills

## 2021-06-17 ENCOUNTER — Other Ambulatory Visit: Payer: Self-pay

## 2021-06-17 ENCOUNTER — Ambulatory Visit: Payer: 59 | Admitting: Cardiovascular Disease

## 2021-06-17 ENCOUNTER — Encounter: Payer: Self-pay | Admitting: Cardiovascular Disease

## 2021-06-17 VITALS — BP 150/92 | HR 76 | Ht 67.0 in | Wt 172.4 lb

## 2021-06-17 DIAGNOSIS — I10 Essential (primary) hypertension: Secondary | ICD-10-CM

## 2021-06-17 DIAGNOSIS — E782 Mixed hyperlipidemia: Secondary | ICD-10-CM | POA: Diagnosis not present

## 2021-06-17 DIAGNOSIS — I2510 Atherosclerotic heart disease of native coronary artery without angina pectoris: Secondary | ICD-10-CM | POA: Diagnosis not present

## 2021-06-17 NOTE — Patient Instructions (Signed)
Medication Instructions:  Your physician recommends that you continue on your current medications as directed. Please refer to the Current Medication list given to you today.  *If you need a refill on your cardiac medications before your next appointment, please call your pharmacy*   Lab Work: NONE If you have labs (blood work) drawn today and your tests are completely normal, you will receive your results only by: [place] (if you have MyChart) OR A paper copy in the mail If you have any lab test that is abnormal or we need to change your treatment, we will call you to review the results.   Testing/Procedures: NONE   Follow-Up: At [facility], you and your health needs are our priority.  As part of our continuing mission to provide you with exceptional heart care, we have created designated Provider Care Teams.  These Care Teams include your primary Cardiologist (physician) and Advanced Practice Providers (APPs -  Physician Assistants and Nurse Practitioners) who all work together to provide you with the care you need, when you need it.   Your next appointment:   1 year(s)  The format for your next appointment:   In Person  Provider:   Sherren Mocha, MD {  Thank you for allowing [place] [facility] [place] to serve you, God Bless!!
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
## 2021-08-21 ENCOUNTER — Other Ambulatory Visit: Payer: Self-pay | Admitting: Physician Assistant

## 2022-04-16 ENCOUNTER — Other Ambulatory Visit: Payer: Self-pay | Admitting: Physician Assistant

## 2022-06-25 LAB — LAB REPORT - SCANNED
A1c: 6
EGFR: 75

## 2022-06-26 ENCOUNTER — Encounter: Payer: Self-pay | Admitting: Cardiovascular Disease

## 2022-07-10 ENCOUNTER — Other Ambulatory Visit: Payer: Self-pay | Admitting: Physician Assistant

## 2022-08-06 ENCOUNTER — Other Ambulatory Visit: Payer: Self-pay | Admitting: Physician Assistant

## 2022-08-21 ENCOUNTER — Encounter: Payer: Self-pay | Admitting: Cardiovascular Disease

## 2022-08-21 ENCOUNTER — Ambulatory Visit: Payer: 59 | Attending: Cardiovascular Disease | Admitting: Cardiovascular Disease

## 2022-08-21 VITALS — BP 126/86 | HR 68 | Ht 67.0 in | Wt 162.2 lb

## 2022-08-21 DIAGNOSIS — I2510 Atherosclerotic heart disease of native coronary artery without angina pectoris: Secondary | ICD-10-CM

## 2022-08-21 DIAGNOSIS — E782 Mixed hyperlipidemia: Secondary | ICD-10-CM

## 2022-08-21 DIAGNOSIS — I10 Essential (primary) hypertension: Secondary | ICD-10-CM | POA: Diagnosis not present

## 2022-08-21 NOTE — Progress Notes (Signed)
Cardiology Office Note:    Date:  08/21/2022   ID:  [name], DOB [birth year], MRN [id number]  PCP:  Aviva Kluver   [facility] Providers Cardiologist:  Tonny Bollman, MD Cardiology APP:  Kennon Rounds     Referring MD: No ref. provider found   Chief Complaint  Patient presents with   Coronary Artery Disease    History of Present Illness:    Shelby Fitzpatrick is a 57 y.o. 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  She has been doing very well.  She started on Wegovy and has lost 10 pounds.  She feels well and she is walking on a treadmill for 30 minutes a day without any exertional symptoms.  She specifically denies chest pain, chest pressure, or shortness of breath.  No leg edema or heart palpitations.  She continues to smoke but is cut back significantly.  Past Medical History:  Diagnosis Date   CAD (coronary artery disease)    coronary intervention and drug-eluting stent placement STEMI-2011   Cath 02/25/2010:  100% RCA (PCI with Promus DES); minor LAD and CFX (nothing more than 20%)  EF 65% at cath 02/25/2010   Chronic headaches    Ectopic pregnancy    (ruptured)   Gastric ulcer    GERD (gastroesophageal reflux disease)    Hyperlipemia    Hypertension    Myocardial infarction     Past Surgical History:  Procedure Laterality Date   ANKLE SURGERY     broken and had surgery to reapair   APPENDECTOMY     at age 22   CARDIAC CATHETERIZATION N/A 05/29/2015   Procedure: Left Heart Cath and Coronary Angiography;  Surgeon: Corky Crafts, MD;  Location: [facility] INVASIVE CV LAB;  Service: Cardiovascular;  Laterality: N/A;   coronary intervention      drug-eluting stent placement-2011   LEFT HEART CATH AND CORONARY ANGIOGRAPHY N/A 12/05/2020   Procedure: LEFT HEART CATH AND CORONARY  ANGIOGRAPHY;  Surgeon: Tonny Bollman, MD;  Location: [facility] INVASIVE CV LAB;  Service: Cardiovascular;  Laterality: N/A;   tubal pregnancy x2     cant recall year    Current Medications: Current Meds  Medication Sig   Acetaminophen-Caffeine (TENSION HEADACHE) 500-65 MG TABS Take 2 tablets by mouth 2 (two) times daily as needed (headaches.).   ALPRAZolam (XANAX) 0.25 MG tablet Take 0.25 mg by mouth daily as needed for anxiety.   aspirin EC 81 MG tablet Take 81 mg by mouth in the morning. Swallow whole.   atorvastatin (LIPITOR) 20 MG tablet TAKE 1 TABLET(20 MG) BY MOUTH DAILY. Pt needs to keep appointment for future refills.   cyclobenzaprine (FLEXERIL) 10 MG tablet Take 10 mg by mouth daily as needed (lower back spasms).   esomeprazole (NEXIUM) 20 MG capsule Take 1 capsule (20 mg total) by mouth daily. Please schedule a yearly follow up for further refills. Thank you   fluticasone (FLONASE) 50 MCG/ACT nasal spray    metoprolol tartrate (LOPRESSOR) 25 MG tablet TAKE 1 TABLET(25 MG) BY MOUTH TWICE DAILY   nitroGLYCERIN (NITROSTAT) 0.4 MG SL tablet Place 1 tablet (0.4 mg total) under the tongue every 5 (five) minutes as needed for chest pain. (Patient taking differently: Place 0.4 mg under the tongue every 5 (five) minutes x 3 doses as needed for  chest pain.)   ondansetron (ZOFRAN) 4 MG tablet one tablet every four hours as needed for nausea   tretinoin (RETIN-A) 0.05 % cream SMARTSIG:Topical Every Evening   WEGOVY 0.5 MG/0.5ML SOAJ Inject into the skin.     Allergies:   Biaxin [clarithromycin] and Penicillins   Social History   Socioeconomic History   Marital status: Married    Spouse name: Not on file   Number of children: 1   Years of education: Not on file   Highest education level: Not on file  Occupational History   Occupation: loan funder  Tobacco Use   Smoking status: Every Day    Packs/day: 0.50    Years: 35.00    Additional pack years: 0.00    Total pack years: 17.50     Types: Cigarettes    Start date: 1984   Smokeless tobacco: Never  Vaping Use   Vaping Use: Every day  Substance and Sexual Activity   Alcohol use: Yes    Alcohol/week: 0.0 standard drinks of alcohol    Comment: rare   Drug use: No   Sexual activity: Not on file  Other Topics Concern   Not on file  Social History Narrative   Not on file   Social Determinants of Health   Financial Resource Strain: Not on file  Food Insecurity: Not on file  Transportation Needs: Not on file  Physical Activity: Not on file  Stress: Not on file  Social Connections: Not on file     Family History: The patient's family history includes Colon polyps in her sister and sister; Coronary artery disease in her father; Heart attack in her father; Hypertension in her father, mother, and sister; Stroke in her father. There is no history of Colon cancer, Stomach cancer, Rectal cancer, Esophageal cancer, or Liver cancer.  ROS:   Please see the history of present illness.    All other systems reviewed and are negative.  EKGs/Labs/Other Studies Reviewed:    The following studies were reviewed today: Cardiac Studies & Procedures   CARDIAC CATHETERIZATION  CARDIAC CATHETERIZATION 12/05/2020  Narrative   Prox RCA-1 lesion is 20% stenosed.   Prox LAD to Mid LAD lesion is 30% stenosed.   Non-stenotic Prox RCA-2 lesion was previously treated.  1.  Patent left mainstem 2.  Patent LAD with mild nonobstructive plaquing in myocardial bridging in the mid vessel unchanged from the previous study 3.  Patent intermediate branch and left circumflex no significant stenosis 4.  Patent RCA and patent stent in the proximal vessel without obstructive disease 5.  Normal LVEDP  Suspect noncardiac chest pain.  Continue current medical program.  Findings Coronary Findings Diagnostic  Dominance: Right  Left Main The left main is patent with no stenosis  Left Anterior Descending The LAD is patent to the apex.  There is  intramyocardial bridging in the mid vessel.  Just after the first septal perforator there is 30% stenosis present. Prox LAD to Mid LAD lesion is 30% stenosed.  Ramus Intermedius Large intermediate branch is patent with no stenosis  Left Circumflex Vessel is angiographically normal.  Right Coronary Artery The right coronary artery is dominant.  The vessel has mild diffuse nonobstructive plaquing.  The stented segment is patent with no stenosis. Prox RCA-1 lesion is 20% stenosed. Non-stenotic Prox RCA-2 lesion was previously treated.  Intervention  No interventions have been documented.   CARDIAC CATHETERIZATION  CARDIAC CATHETERIZATION 05/29/2015  Narrative  Patent stent in the RCA.  The left ventricular  systolic function is normal.  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  Will stop Brilinta which was started in the office yesterday in anticipation of need for PCI.  Continue aggressive secondary prevention.  Findings Coronary Findings Diagnostic  Dominance: Right  Left Anterior Descending Myocardial bridging in the mid vessel.  Right Coronary Artery  Previously placed Prox RCA-2 drug eluting stent is patent.  Intervention  No interventions have been documented.                 EKG:  EKG is ordered today.  The ekg ordered today demonstrates normal sinus rhythm 68 bpm, nonspecific T wave abnormality.  Recent Labs: No results found for requested labs within last 365 days.  Recent Lipid Panel    Component Value Date/Time   CHOL 136 09/17/2020 0741   TRIG 144 09/17/2020 0741   HDL 46 09/17/2020 0741   CHOLHDL 3.0 09/17/2020 0741   CHOLHDL 2.5 07/20/2015 0814   VLDL 24 07/20/2015 0814   LDLCALC 65 09/17/2020 0741     Risk Assessment/Calculations:                Physical Exam:    VS:  BP 126/86   Pulse 68   Ht 5\' 7"  (1.702 m)   Wt 162 lb 3.2 oz (73.6 kg)   SpO2 96%   BMI 25.40 kg/m     Wt Readings from Last 3 Encounters:  08/21/22 162  lb 3.2 oz (73.6 kg)  06/17/21 172 lb 6.4 oz (78.2 kg)  12/05/20 166 lb (75.3 kg)     GEN:  Well nourished, well developed in no acute distress HEENT: Normal NECK: No JVD; No carotid bruits LYMPHATICS: No lymphadenopathy CARDIAC: RRR, no murmurs, rubs, gallops RESPIRATORY:  Clear to auscultation without rales, wheezing or rhonchi  ABDOMEN: Soft, non-tender, non-distended MUSCULOSKELETAL:  No edema; No deformity  SKIN: Warm and dry NEUROLOGIC:  Alert and oriented x 3 PSYCHIATRIC:  Normal affect   ASSESSMENT:    1. Coronary artery disease involving native coronary artery of native heart without angina pectoris   2. Essential hypertension   3. Mixed hyperlipidemia    PLAN:    In order of problems listed above:  The patient remained stable without symptoms of angina.  She is treated with aspirin, atorvastatin, and metoprolol.  Her nitroglycerin is refilled.  She does not have any other concerns today.  I will see her back in 1 year. Blood pressure in good range.  Continue metoprolol.  Encouraged her on her lifestyle modification including her weight loss, regular exercise, and efforts at tobacco cessation. On atorvastatin.  Lipids followed by PCP.  Last LDL on file was 65 mg/dL.      Medication Adjustments/Labs and Tests Ordered: Current medicines are reviewed at length with the patient today.  Concerns regarding medicines are outlined above.  Orders Placed This Encounter  Procedures   EKG 12-Lead   No orders of the defined types were placed in this encounter.   Patient Instructions  Medication Instructions:  Your physician recommends that you continue on your current medications as directed. Please refer to the Current Medication list given to you today.  *If you need a refill on your cardiac medications before your next appointment, please call your pharmacy*   Lab Work: NONE If you have labs (blood work) drawn today and your tests are completely normal, you will  receive your results only by: MyChart Message (if you have MyChart) OR A paper copy in the  mail If you have any lab test that is abnormal or we need to change your treatment, we will call you to review the results.   Testing/Procedures: NONE   Follow-Up: At [facility], you and your health needs are our priority.  As part of our continuing mission to provide you with exceptional heart care, we have created designated Provider Care Teams.  These Care Teams include your primary Cardiologist (physician) and Advanced Practice Providers (APPs -  Physician Assistants and Nurse Practitioners) who all work together to provide you with the care you need, when you need it.  We recommend signing up for the patient portal called "MyChart".  Sign up information is provided on this After Visit Summary.  MyChart is used to connect with patients for Virtual Visits (Telemedicine).  Patients are able to view lab/test results, encounter notes, upcoming appointments, etc.  Non-urgent messages can be sent to your provider as well.   To learn more about what you can do with MyChart, go to [email or web address].    Your next appointment:   1 year(s)  Provider:   Tonny Bollman, MD        Signed, Tonny Bollman, MD  08/21/2022 4:49 PM    [facility]

## 2022-08-21 NOTE — Patient Instructions (Signed)
Medication Instructions:  Your physician recommends that you continue on your current medications as directed. Please refer to the Current Medication list given to you today.  *If you need a refill on your cardiac medications before your next appointment, please call your pharmacy*   Lab Work: NONE If you have labs (blood work) drawn today and your tests are completely normal, you will receive your results only by: MyChart Message (if you have MyChart) OR A paper copy in the mail If you have any lab test that is abnormal or we need to change your treatment, we will call you to review the results.   Testing/Procedures: NONE   Follow-Up: At [facility], you and your health needs are our priority.  As part of our continuing mission to provide you with exceptional heart care, we have created designated Provider Care Teams.  These Care Teams include your primary Cardiologist (physician) and Advanced Practice Providers (APPs -  Physician Assistants and Nurse Practitioners) who all work together to provide you with the care you need, when you need it.  We recommend signing up for the patient portal called "MyChart".  Sign up information is provided on this After Visit Summary.  MyChart is used to connect with patients for Virtual Visits (Telemedicine).  Patients are able to view lab/test results, encounter notes, upcoming appointments, etc.  Non-urgent messages can be sent to your provider as well.   To learn more about what you can do with MyChart, go to [email or web address].    Your next appointment:   1 year(s)  Provider:   Michael Cooper, MD      

## 2022-09-03 ENCOUNTER — Other Ambulatory Visit: Payer: Self-pay | Admitting: Physician Assistant

## 2022-10-01 ENCOUNTER — Other Ambulatory Visit: Payer: Self-pay | Admitting: Cardiovascular Disease

## 2022-10-02 ENCOUNTER — Other Ambulatory Visit: Payer: Self-pay

## 2022-10-02 MED ORDER — METOPROLOL TARTRATE 25 MG PO TABS: ORAL_TABLET | ORAL | 3 refills | Status: DC

## 2023-06-16 ENCOUNTER — Encounter: Payer: Self-pay | Admitting: Gastroenterology

## 2023-07-27 DIAGNOSIS — Z01419 Encounter for gynecological examination (general) (routine) without abnormal findings: Secondary | ICD-10-CM | POA: Diagnosis not present

## 2023-07-27 DIAGNOSIS — Z124 Encounter for screening for malignant neoplasm of cervix: Secondary | ICD-10-CM | POA: Diagnosis not present

## 2023-07-27 DIAGNOSIS — Z1231 Encounter for screening mammogram for malignant neoplasm of breast: Secondary | ICD-10-CM | POA: Diagnosis not present

## 2023-07-27 DIAGNOSIS — Z0000 Encounter for general adult medical examination without abnormal findings: Secondary | ICD-10-CM | POA: Diagnosis not present

## 2023-08-18 ENCOUNTER — Other Ambulatory Visit: Payer: Self-pay | Admitting: Physician Assistant

## 2023-08-23 NOTE — Progress Notes (Unsigned)
 Cardiology Office Note:    Date:  08/24/2023   ID:  Shelby Fitzpatrick, DOB Jun 08, 1965, MRN [id number]  PCP:  Juvenal Opoka   [facility] Providers Cardiologist:  Arnoldo Lapping, MD Cardiology APP:  Sherwood Donath     Referring MD: No ref. provider found   Chief Complaint  Patient presents with   Coronary Artery Disease    History of Present Illness:    Shelby Fitzpatrick is a 58 y.o. female with a hx of:  Coronary artery disease Inferior MI in 2011 s/p DES to RCA Cath 2017: RCA stent patent Cath 2022: RCA stent patent, stable anatomy Hypertension Hyperlipidemia   The patient is here alone today.  She has had a difficult year.  Her husband passed away in 2024/01/07 of last year.  Her daughter lives in [place] and she did have her first grandchild this year.  The patient is considering relocating to the [place] area in the next few years.  She is doing fine from a cardiac perspective and specifically denies chest pain, chest pressure, or shortness of breath.  No heart palpitations.  She is decreased her metoprolol  to once daily because she has been running low blood pressures at home and feeling dizzy at times.  Her symptoms have resolved since she is started taking Lopressor  only once daily.  She has no other complaints today.    Current Medications: Current Meds  Medication Sig   Acetaminophen -Caffeine (TENSION HEADACHE) 500-65 MG TABS Take 2 tablets by mouth 2 (two) times daily as needed (headaches.).   ALPRAZolam (XANAX) 0.25 MG tablet Take 0.25 mg by mouth daily as needed for anxiety.   aspirin  EC 81 MG tablet Take 81 mg by mouth in the morning. Swallow whole.   atorvastatin  (LIPITOR) 20 MG tablet TAKE 1 TABLET(20 MG) BY MOUTH DAILY   cyclobenzaprine (FLEXERIL) 10 MG tablet Take 10 mg by mouth daily as needed (lower back spasms).   fluticasone  (FLONASE ) 50 MCG/ACT nasal spray daily.   metoprolol  succinate (TOPROL  XL) 25 MG 24 hr tablet Take 0.5 tablets (12.5 mg  total) by mouth daily.   ondansetron  (ZOFRAN ) 4 MG tablet one tablet every four hours as needed for nausea   WEGOVY 0.5 MG/0.5ML SOAJ Inject 1.7 mg into the skin as needed.   [DISCONTINUED] esomeprazole  (NEXIUM ) 20 MG capsule Take 1 capsule (20 mg total) by mouth daily. Please schedule a yearly follow up for further refills. Thank you   [DISCONTINUED] metoprolol  tartrate (LOPRESSOR ) 25 MG tablet TAKE 1 TABLET(25 MG) BY MOUTH TWICE DAILY (Patient taking differently: Take 25 mg by mouth daily. TAKE 1 TABLET(25 MG) BY MOUTH TWICE DAILY)   [DISCONTINUED] nitroGLYCERIN  (NITROSTAT ) 0.4 MG SL tablet Place 1 tablet (0.4 mg total) under the tongue every 5 (five) minutes as needed for chest pain. (Patient taking differently: Place 0.4 mg under the tongue every 5 (five) minutes x 3 doses as needed for chest pain.)   [DISCONTINUED] tretinoin (RETIN-A) 0.05 % cream SMARTSIG:Topical Every Evening     Allergies:   Biaxin [clarithromycin] and Penicillins   ROS:   Please see the history of present illness.    All other systems reviewed and are negative.  EKGs/Labs/Other Studies Reviewed:    The following studies were reviewed today: Cardiac Studies & Procedures   ______________________________________________________________________________________________ CARDIAC CATHETERIZATION  CARDIAC CATHETERIZATION 12/05/2020  Conclusion   Prox RCA-1 lesion is 20% stenosed.   Prox LAD to Mid LAD lesion is 30% stenosed.   Non-stenotic Prox RCA-2 lesion was  previously treated.  1.  Patent left mainstem 2.  Patent LAD with mild nonobstructive plaquing in myocardial bridging in the mid vessel unchanged from the previous study 3.  Patent intermediate branch and left circumflex no significant stenosis 4.  Patent RCA and patent stent in the proximal vessel without obstructive disease 5.  Normal LVEDP  Suspect noncardiac chest pain.  Continue current medical program.  Findings Coronary Findings Diagnostic   Dominance: Right  Left Main The left main is patent with no stenosis  Left Anterior Descending The LAD is patent to the apex.  There is intramyocardial bridging in the mid vessel.  Just after the first septal perforator there is 30% stenosis present. Prox LAD to Mid LAD lesion is 30% stenosed.  Ramus Intermedius Large intermediate branch is patent with no stenosis  Left Circumflex Vessel is angiographically normal.  Right Coronary Artery The right coronary artery is dominant.  The vessel has mild diffuse nonobstructive plaquing.  The stented segment is patent with no stenosis. Prox RCA-1 lesion is 20% stenosed. Non-stenotic Prox RCA-2 lesion was previously treated.  Intervention  No interventions have been documented.   CARDIAC CATHETERIZATION  CARDIAC CATHETERIZATION 05/29/2015  Conclusion  Patent stent in the RCA.  The left ventricular systolic function is normal.  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  Will stop Brilinta  which was started in the office yesterday in anticipation of need for PCI.  Continue aggressive secondary prevention.  Findings Coronary Findings Diagnostic  Dominance: Right  Left Anterior Descending Myocardial bridging in the mid vessel.  Right Coronary Artery  Previously placed Prox RCA-2 drug eluting stent is patent.  Intervention  No interventions have been documented.              ______________________________________________________________________________________________      EKG:   EKG Interpretation Date/Time:  Monday August 24 2023 08:25:50 EDT Ventricular Rate:  60 PR Interval:  160 QRS Duration:  82 QT Interval:  416 QTC Calculation: 416 R Axis:   60  Text Interpretation: Normal sinus rhythm with sinus arrhythmia T wave abnormality, consider inferior ischemia When compared with ECG of 27-Oct-2020 20:13, Vent. rate has decreased BY  49 BPM T wave inversion now evident in Inferior leads T wave inversion no  longer evident in Lateral leads Confirmed by Arnoldo Lapping [phone]) on 08/24/2023 8:36:19 AM    Recent Labs: No results found for requested labs within last 365 days.  Recent Lipid Panel    Component Value Date/Time   CHOL 136 09/17/2020 0741   TRIG 144 09/17/2020 0741   HDL 46 09/17/2020 0741   CHOLHDL 3.0 09/17/2020 0741   CHOLHDL 2.5 07/20/2015 0814   VLDL 24 07/20/2015 0814   LDLCALC 65 09/17/2020 0741         Physical Exam:    VS:  BP (!) 120/92   Pulse 60   Ht 5\' 7"  (1.702 m)   Wt 133 lb 6.4 oz (60.5 kg)   SpO2 97%   BMI 20.89 kg/m     Wt Readings from Last 3 Encounters:  08/24/23 133 lb 6.4 oz (60.5 kg)  08/21/22 162 lb 3.2 oz (73.6 kg)  06/17/21 172 lb 6.4 oz (78.2 kg)     GEN:  Well nourished, well developed in no acute distress HEENT: Normal NECK: No JVD; No carotid bruits LYMPHATICS: No lymphadenopathy CARDIAC: RRR, no murmurs, rubs, gallops RESPIRATORY:  Clear to auscultation without rales, wheezing or rhonchi  ABDOMEN: Soft, non-tender, non-distended MUSCULOSKELETAL:  No edema; No  deformity  SKIN: Warm and dry NEUROLOGIC:  Alert and oriented x 3 PSYCHIATRIC:  Normal affect   Assessment & Plan Coronary artery disease involving native coronary artery of native heart without angina pectoris The patient is stable with no symptoms of angina.  Her EKG is compared to old tracings and there are no significant changes.  Continues on aspirin  and atorvastatin .  Continue current medical therapy.  She still smokes about 1/2 pack of cigarettes daily.  Cessation counseling done. Essential hypertension Blood pressure has been running low based on her home readings and she has had symptoms associated with this.  Since she is only taking metoprolol  once daily, to change her to metoprolol  succinate at a reduced dose of 12.5 mg daily.  She will call in if further symptoms occur. Mixed hyperlipidemia Treated with atorvastatin .  I have some of her labs recently done in  March of this year with a cholesterol of 125, HDL 65, triglycerides 95, ALT 29.  She is at goal and will continue current medications.      Medication Adjustments/Labs and Tests Ordered: Current medicines are reviewed at length with the patient today.  Concerns regarding medicines are outlined above.  Orders Placed This Encounter  Procedures   EKG 12-Lead   Meds ordered this encounter  Medications   nitroGLYCERIN  (NITROSTAT ) 0.4 MG SL tablet    Sig: Place 1 tablet (0.4 mg total) under the tongue every 5 (five) minutes as needed for chest pain.    Dispense:  25 tablet    Refill:  3   metoprolol  succinate (TOPROL  XL) 25 MG 24 hr tablet    Sig: Take 0.5 tablets (12.5 mg total) by mouth daily.    Dispense:  45 tablet    Refill:  3    Change from Lopressor  to Toprol     Patient Instructions  Medication Instructions:  Stop Metoprolol  Tartrate START Metoprolol  Succinate 12.5mg  daily (Toprol  XL) *If you need a refill on your cardiac medications before your next appointment, please call your pharmacy*  Follow-Up: At [facility], you and your health needs are our priority.  As part of our continuing mission to provide you with exceptional heart care, our providers are all part of one team.  This team includes your primary Cardiologist (physician) and Advanced Practice Providers or APPs (Physician Assistants and Nurse Practitioners) who all work together to provide you with the care you need, when you need it.  Your next appointment:   1 year(s)  Provider:   Arnoldo Lapping, MD       1st Floor: - Lobby - Registration  - Pharmacy  - Lab - Cafe  2nd Floor: - PV Lab - Diagnostic Testing (echo, CT, nuclear med)  3rd Floor: - Vacant  4th Floor: - TCTS (cardiothoracic surgery) - AFib Clinic - Structural Heart Clinic - Vascular Surgery  - Vascular Ultrasound  5th Floor: - HeartCare Cardiology (general and EP) - Clinical Pharmacy for coumadin, hypertension, lipid,  weight-loss medications, and med management appointments    Valet parking services will be available as well.     Signed, Arnoldo Lapping, MD  08/24/2023 8:51 AM    [facility]

## 2023-08-24 ENCOUNTER — Ambulatory Visit: Payer: 59 | Attending: Cardiovascular Disease | Admitting: Cardiovascular Disease

## 2023-08-24 ENCOUNTER — Encounter: Payer: Self-pay | Admitting: Cardiovascular Disease

## 2023-08-24 VITALS — BP 120/92 | HR 60 | Ht 67.0 in | Wt 133.4 lb

## 2023-08-24 DIAGNOSIS — I2510 Atherosclerotic heart disease of native coronary artery without angina pectoris: Secondary | ICD-10-CM | POA: Diagnosis not present

## 2023-08-24 DIAGNOSIS — I10 Essential (primary) hypertension: Secondary | ICD-10-CM | POA: Diagnosis not present

## 2023-08-24 DIAGNOSIS — E782 Mixed hyperlipidemia: Secondary | ICD-10-CM | POA: Diagnosis not present

## 2023-08-24 MED ORDER — METOPROLOL SUCCINATE ER 25 MG PO TB24: 12.5000 mg | ORAL_TABLET | Freq: Every day | ORAL | 3 refills | Status: AC

## 2023-08-24 MED ORDER — NITROGLYCERIN 0.4 MG SL SUBL: 0.4000 mg | SUBLINGUAL_TABLET | SUBLINGUAL | 3 refills | Status: AC | PRN

## 2023-08-24 NOTE — Assessment & Plan Note (Signed)
 The patient is stable with no symptoms of angina.  Her EKG is compared to old tracings and there are no significant changes.  Continues on aspirin  and atorvastatin .  Continue current medical therapy.  She still smokes about 1/2 pack of cigarettes daily.  Cessation counseling done.

## 2023-08-24 NOTE — Assessment & Plan Note (Signed)
 Blood pressure has been running low based on her home readings and she has had symptoms associated with this.  Since she is only taking metoprolol  once daily, to change her to metoprolol  succinate at a reduced dose of 12.5 mg daily.  She will call in if further symptoms occur.

## 2023-08-24 NOTE — Assessment & Plan Note (Signed)
 Treated with atorvastatin .  I have some of her labs recently done in March of this year with a cholesterol of 125, HDL 65, triglycerides 95, ALT 29.  She is at goal and will continue current medications.

## 2023-08-24 NOTE — Patient Instructions (Signed)
 Medication Instructions:  Stop Metoprolol  Tartrate START Metoprolol  Succinate 12.5mg  daily (Toprol  XL) *If you need a refill on your cardiac medications before your next appointment, please call your pharmacy*  Follow-Up: At [facility], you and your health needs are our priority.  As part of our continuing mission to provide you with exceptional heart care, our providers are all part of one team.  This team includes your primary Cardiologist (physician) and Advanced Practice Providers or APPs (Physician Assistants and Nurse Practitioners) who all work together to provide you with the care you need, when you need it.  Your next appointment:   1 year(s)  Provider:   Arnoldo Lapping, MD       1st Floor: - Lobby - Registration  - Pharmacy  - Lab - Cafe  2nd Floor: - PV Lab - Diagnostic Testing (echo, CT, nuclear med)  3rd Floor: - Vacant  4th Floor: - TCTS (cardiothoracic surgery) - AFib Clinic - Structural Heart Clinic - Vascular Surgery  - Vascular Ultrasound  5th Floor: - [facility] (general and EP) - Clinical Pharmacy for coumadin, hypertension, lipid, weight-loss medications, and med management appointments    Valet parking services will be available as well.

## 2023-09-06 ENCOUNTER — Encounter: Payer: Self-pay | Admitting: Cardiovascular Disease

## 2023-09-18 ENCOUNTER — Other Ambulatory Visit: Payer: Self-pay | Admitting: Cardiovascular Disease

## 2023-11-17 ENCOUNTER — Other Ambulatory Visit: Payer: Self-pay | Admitting: Physician Assistant

## 2024-02-03 DIAGNOSIS — H18821 Corneal disorder due to contact lens, right eye: Secondary | ICD-10-CM | POA: Diagnosis not present
